# Patient Record
Sex: Female | Born: 1945
Health system: Southern US, Community
[De-identification: ages and names within clinical notes are randomized; demographics above are authoritative.]

## PROBLEM LIST (undated history)

## (undated) DIAGNOSIS — E039 Hypothyroidism, unspecified: Secondary | ICD-10-CM

## (undated) DIAGNOSIS — F32A Depression, unspecified: Secondary | ICD-10-CM

## (undated) DIAGNOSIS — J449 Chronic obstructive pulmonary disease, unspecified: Secondary | ICD-10-CM

## (undated) DIAGNOSIS — F329 Major depressive disorder, single episode, unspecified: Secondary | ICD-10-CM

## (undated) HISTORY — PX: VAGINAL HYSTERECTOMY: SUR661

## (undated) HISTORY — PX: KNEE SURGERY: SHX244

## (undated) HISTORY — PX: APPENDECTOMY: SHX54

---

## 2013-04-30 ENCOUNTER — Emergency Department (HOSPITAL_COMMUNITY): Payer: Medicare Other

## 2013-04-30 ENCOUNTER — Inpatient Hospital Stay (HOSPITAL_COMMUNITY)
Admission: EM | Admit: 2013-04-30 | Discharge: 2013-05-03 | DRG: 481 | Disposition: A | Discharge status: Home Health | Payer: Medicare Other | Attending: Internal Medicine | Admitting: Internal Medicine

## 2013-04-30 ENCOUNTER — Encounter (HOSPITAL_COMMUNITY): Payer: Self-pay | Admitting: Emergency Medicine

## 2013-04-30 ENCOUNTER — Inpatient Hospital Stay (HOSPITAL_COMMUNITY): Payer: Medicare Other

## 2013-04-30 DIAGNOSIS — Y92009 Unspecified place in unspecified non-institutional (private) residence as the place of occurrence of the external cause: Secondary | ICD-10-CM

## 2013-04-30 DIAGNOSIS — E039 Hypothyroidism, unspecified: Secondary | ICD-10-CM | POA: Diagnosis present

## 2013-04-30 DIAGNOSIS — F411 Generalized anxiety disorder: Secondary | ICD-10-CM | POA: Diagnosis present

## 2013-04-30 DIAGNOSIS — I959 Hypotension, unspecified: Secondary | ICD-10-CM | POA: Diagnosis present

## 2013-04-30 DIAGNOSIS — F17210 Nicotine dependence, cigarettes, uncomplicated: Secondary | ICD-10-CM

## 2013-04-30 DIAGNOSIS — J4489 Other specified chronic obstructive pulmonary disease: Secondary | ICD-10-CM | POA: Diagnosis present

## 2013-04-30 DIAGNOSIS — S72143A Displaced intertrochanteric fracture of unspecified femur, initial encounter for closed fracture: Principal | ICD-10-CM | POA: Diagnosis present

## 2013-04-30 DIAGNOSIS — Z7982 Long term (current) use of aspirin: Secondary | ICD-10-CM

## 2013-04-30 DIAGNOSIS — D62 Acute posthemorrhagic anemia: Secondary | ICD-10-CM | POA: Diagnosis not present

## 2013-04-30 DIAGNOSIS — W010XXA Fall on same level from slipping, tripping and stumbling without subsequent striking against object, initial encounter: Secondary | ICD-10-CM | POA: Diagnosis present

## 2013-04-30 DIAGNOSIS — J441 Chronic obstructive pulmonary disease with (acute) exacerbation: Secondary | ICD-10-CM

## 2013-04-30 DIAGNOSIS — S72009A Fracture of unspecified part of neck of unspecified femur, initial encounter for closed fracture: Secondary | ICD-10-CM

## 2013-04-30 DIAGNOSIS — Z79899 Other long term (current) drug therapy: Secondary | ICD-10-CM

## 2013-04-30 DIAGNOSIS — S72002A Fracture of unspecified part of neck of left femur, initial encounter for closed fracture: Secondary | ICD-10-CM | POA: Diagnosis present

## 2013-04-30 DIAGNOSIS — F39 Unspecified mood [affective] disorder: Secondary | ICD-10-CM | POA: Diagnosis present

## 2013-04-30 DIAGNOSIS — J449 Chronic obstructive pulmonary disease, unspecified: Secondary | ICD-10-CM | POA: Diagnosis present

## 2013-04-30 DIAGNOSIS — D72829 Elevated white blood cell count, unspecified: Secondary | ICD-10-CM | POA: Diagnosis present

## 2013-04-30 DIAGNOSIS — F17200 Nicotine dependence, unspecified, uncomplicated: Secondary | ICD-10-CM | POA: Diagnosis present

## 2013-04-30 HISTORY — DX: Hypothyroidism, unspecified: E03.9

## 2013-04-30 HISTORY — DX: Depression, unspecified: F32.A

## 2013-04-30 HISTORY — DX: Major depressive disorder, single episode, unspecified: F32.9

## 2013-04-30 LAB — CBC WITH DIFFERENTIAL/PLATELET
BASOS PCT: 0 % (ref 0–1)
Basophils Absolute: 0 10*3/uL (ref 0.0–0.1)
EOS ABS: 0 10*3/uL (ref 0.0–0.7)
EOS PCT: 0 % (ref 0–5)
HCT: 42.4 % (ref 36.0–46.0)
Hemoglobin: 14.5 g/dL (ref 12.0–15.0)
LYMPHS ABS: 1.5 10*3/uL (ref 0.7–4.0)
Lymphocytes Relative: 10 % — ABNORMAL LOW (ref 12–46)
MCH: 34.4 pg — ABNORMAL HIGH (ref 26.0–34.0)
MCHC: 34.2 g/dL (ref 30.0–36.0)
MCV: 100.7 fL — AB (ref 78.0–100.0)
Monocytes Absolute: 0.5 10*3/uL (ref 0.1–1.0)
Monocytes Relative: 3 % (ref 3–12)
Neutro Abs: 12.9 10*3/uL — ABNORMAL HIGH (ref 1.7–7.7)
Neutrophils Relative %: 87 % — ABNORMAL HIGH (ref 43–77)
PLATELETS: 244 10*3/uL (ref 150–400)
RBC: 4.21 MIL/uL (ref 3.87–5.11)
RDW: 12.7 % (ref 11.5–15.5)
WBC: 14.8 10*3/uL — ABNORMAL HIGH (ref 4.0–10.5)

## 2013-04-30 LAB — URINE MICROSCOPIC-ADD ON

## 2013-04-30 LAB — CG4 I-STAT (LACTIC ACID): Lactic Acid, Venous: 2 mmol/L (ref 0.5–2.2)

## 2013-04-30 LAB — SURGICAL PCR SCREEN
MRSA, PCR: INVALID — AB
Staphylococcus aureus: INVALID — AB

## 2013-04-30 LAB — BASIC METABOLIC PANEL
BUN: 12 mg/dL (ref 6–23)
CALCIUM: 9.1 mg/dL (ref 8.4–10.5)
CO2: 24 meq/L (ref 19–32)
Chloride: 108 mEq/L (ref 96–112)
Creatinine, Ser: 0.68 mg/dL (ref 0.50–1.10)
GFR calc Af Amer: >90 mL/min (ref >90)
GFR calc non Af Amer: 90 mL/min — ABNORMAL LOW (ref >90)
Glucose, Bld: 123 mg/dL — ABNORMAL HIGH (ref 70–99)
Potassium: 4.8 mEq/L (ref 3.7–5.3)
SODIUM: 144 meq/L (ref 137–147)

## 2013-04-30 LAB — TYPE AND SCREEN
ABO/RH(D): O POS
Antibody Screen: NEGATIVE

## 2013-04-30 LAB — CORTISOL: Cortisol, Plasma: 28.2 ug/dL

## 2013-04-30 LAB — VITAMIN B12: VITAMIN B 12: 342 pg/mL (ref 211–911)

## 2013-04-30 LAB — URINALYSIS, ROUTINE W REFLEX MICROSCOPIC
Bilirubin Urine: NEGATIVE
Glucose, UA: NEGATIVE mg/dL
Ketones, ur: 15 mg/dL — AB
LEUKOCYTES UA: NEGATIVE
NITRITE: NEGATIVE
Protein, ur: 30 mg/dL — AB
Specific Gravity, Urine: 1.022 (ref 1.005–1.030)
Urobilinogen, UA: 0.2 mg/dL (ref 0.0–1.0)
pH: 5.5 (ref 5.0–8.0)

## 2013-04-30 LAB — FOLATE: FOLATE: 9.4 ng/mL

## 2013-04-30 LAB — ABO/RH: ABO/RH(D): O POS

## 2013-04-30 LAB — PROTIME-INR
INR: 0.92 (ref 0.00–1.49)
PROTHROMBIN TIME: 12.2 s (ref 11.6–15.2)

## 2013-04-30 MED ORDER — ONDANSETRON HCL 4 MG/2ML IJ SOLN
4.0000 mg | Freq: Once | INTRAMUSCULAR | Status: AC
  Administered 2013-04-30: 4 mg via INTRAVENOUS
  Filled 2013-04-30: qty 2

## 2013-04-30 MED ORDER — NICOTINE 14 MG/24HR TD PT24
14.0000 mg | MEDICATED_PATCH | Freq: Every day | TRANSDERMAL | Status: DC
  Administered 2013-04-30 – 2013-05-03 (×4): 14 mg via TRANSDERMAL
  Filled 2013-04-30 (×4): qty 1

## 2013-04-30 MED ORDER — FENTANYL CITRATE 0.05 MG/ML IJ SOLN
50.0000 ug | INTRAMUSCULAR | Status: AC | PRN
  Administered 2013-04-30 (×2): 50 ug via INTRAVENOUS
  Filled 2013-04-30 (×2): qty 2

## 2013-04-30 MED ORDER — MORPHINE SULFATE 2 MG/ML IJ SOLN
0.5000 mg | INTRAMUSCULAR | Status: DC | PRN
  Administered 2013-04-30 (×2): 0.5 mg via INTRAVENOUS
  Filled 2013-04-30 (×2): qty 1

## 2013-04-30 MED ORDER — SODIUM CHLORIDE 0.9 % IV SOLN
INTRAVENOUS | Status: AC
  Administered 2013-04-30: 06:00:00 via INTRAVENOUS

## 2013-04-30 MED ORDER — SODIUM CHLORIDE 0.9 % IV SOLN
1000.0000 mL | INTRAVENOUS | Status: DC
  Administered 2013-04-30: 1000 mL via INTRAVENOUS

## 2013-04-30 MED ORDER — LEVOTHYROXINE SODIUM 88 MCG PO TABS
88.0000 ug | ORAL_TABLET | Freq: Every day | ORAL | Status: DC
  Administered 2013-04-30 – 2013-05-03 (×3): 88 ug via ORAL
  Filled 2013-04-30 (×5): qty 1

## 2013-04-30 MED ORDER — ALPRAZOLAM 0.5 MG PO TABS
0.5000 mg | ORAL_TABLET | Freq: Every day | ORAL | Status: DC | PRN
  Administered 2013-04-30 – 2013-05-02 (×3): 0.5 mg via ORAL
  Filled 2013-04-30 (×3): qty 1

## 2013-04-30 MED ORDER — VENLAFAXINE HCL ER 150 MG PO CP24
150.0000 mg | ORAL_CAPSULE | Freq: Every day | ORAL | Status: DC
  Administered 2013-04-30 – 2013-05-03 (×4): 150 mg via ORAL
  Filled 2013-04-30 (×5): qty 1

## 2013-04-30 MED ORDER — SODIUM CHLORIDE 0.9 % IV BOLUS (SEPSIS)
1000.0000 mL | Freq: Once | INTRAVENOUS | Status: AC
  Administered 2013-04-30: 1000 mL via INTRAVENOUS

## 2013-04-30 MED ORDER — SODIUM CHLORIDE 0.9 % IV SOLN: INTRAVENOUS | Status: DC

## 2013-04-30 MED ORDER — ENOXAPARIN SODIUM 40 MG/0.4ML ~~LOC~~ SOLN
40.0000 mg | SUBCUTANEOUS | Status: DC
  Administered 2013-04-30: 40 mg via SUBCUTANEOUS
  Filled 2013-04-30 (×2): qty 0.4

## 2013-04-30 MED ORDER — HYDROCODONE-ACETAMINOPHEN 5-325 MG PO TABS
1.0000 | ORAL_TABLET | Freq: Four times a day (QID) | ORAL | Status: DC | PRN
  Administered 2013-04-30 (×3): 2 via ORAL
  Filled 2013-04-30 (×3): qty 2

## 2013-04-30 MED ORDER — IPRATROPIUM-ALBUTEROL 0.5-2.5 (3) MG/3ML IN SOLN: 3.0000 mL | Freq: Four times a day (QID) | RESPIRATORY_TRACT | Status: DC | PRN

## 2013-04-30 MED ORDER — SENNA 8.6 MG PO TABS
1.0000 | ORAL_TABLET | Freq: Two times a day (BID) | ORAL | Status: DC
  Administered 2013-04-30 – 2013-05-03 (×7): 8.6 mg via ORAL
  Filled 2013-04-30 (×8): qty 1

## 2013-04-30 MED ORDER — FLUOXETINE HCL 20 MG PO CAPS
20.0000 mg | ORAL_CAPSULE | Freq: Every day | ORAL | Status: DC
  Administered 2013-04-30 – 2013-05-03 (×4): 20 mg via ORAL
  Filled 2013-04-30 (×4): qty 1

## 2013-04-30 NOTE — Consult Note (Signed)
ORTHOPAEDIC CONSULTATION  REQUESTING PHYSICIAN: Berle Mull, MD  Chief Complaint: Left hip fracture  HPI: Victoria Gaines is a 68 y.o. female who complains of left hip fracture s/p mechanical fall.  Denies LOC, neck pain, abd pain.  Denies hip pain prior to fall.  No past medical history on file. No past surgical history on file. History   Social History  . Marital Status: Married    Spouse Name: N/A    Number of Children: N/A  . Years of Education: N/A   Social History Main Topics  . Smoking status: Not on file  . Smokeless tobacco: Not on file  . Alcohol Use: Not on file  . Drug Use: Not on file  . Sexual Activity: Not on file   Other Topics Concern  . Not on file   Social History Narrative  . No narrative on file   No family history on file. Allergies  Allergen Reactions  . Macrodantin [Nitrofurantoin Macrocrystal] Rash   Prior to Admission medications   Medication Sig Start Date End Date Taking? Authorizing Provider  ALPRAZolam Duanne Moron) 0.5 MG tablet Take 0.5 mg by mouth daily as needed for anxiety.   Yes Historical Provider, MD  FLUoxetine (PROZAC) 20 MG capsule Take 20 mg by mouth daily.   Yes Historical Provider, MD  levothyroxine (SYNTHROID, LEVOTHROID) 88 MCG tablet Take 88 mcg by mouth daily before breakfast.   Yes Historical Provider, MD  venlafaxine XR (EFFEXOR-XR) 150 MG 24 hr capsule Take 150 mg by mouth daily with breakfast.   Yes Historical Provider, MD   Dg Chest 1 View  04/30/2013   CLINICAL DATA:  Fall, hip pain.  EXAM: CHEST - 1 VIEW  COMPARISON:  Chest radiograph July 21, 2008  FINDINGS: Cardiomediastinal silhouette is unremarkable. The lungs are clear without pleural effusions or focal consolidations. Pulmonary vasculature is unremarkable. Trachea projects midline and there is no pneumothorax. Soft tissue planes and included osseous structures are nonsuspicious.  IMPRESSION: No acute cardiopulmonary process.   Electronically Signed   By: Elon Alas   On: 04/30/2013 04:50   Dg Hip Complete Left  04/30/2013   CLINICAL DATA:  Fall, hip pain.  EXAM: LEFT HIP - COMPLETE 2+ VIEW  COMPARISON:  None available for comparison at time of study interpretation.  FINDINGS: Comminuted impacted left intertrochanteric femur fracture with varus angulation distal bony fragment. Femoral head is located. No dislocation. No destructive bony lesions. Periarticular soft tissue planes are nonsuspicious. Phleboliths in the pelvis.  IMPRESSION: Comminuted impacted displaced left hip femur intertrochanteric fracture without dislocation.   Electronically Signed   By: Elon Alas   On: 04/30/2013 04:49    Positive ROS: All other systems have been reviewed and were otherwise negative with the exception of those mentioned in the HPI and as above.  Physical Exam: General: Alert, no acute distress Cardiovascular: No pedal edema Respiratory: No cyanosis, no use of accessory musculature GI: No organomegaly, abdomen is soft and non-tender Skin: No lesions in the area of chief complaint Neurologic: Sensation intact distally Psychiatric: Patient is competent for consent with normal mood and affect Lymphatic: No axillary or cervical lymphadenopathy  MUSCULOSKELETAL:  Left hip - limited ROM due to pain - WWP and NVI - skin intact  Assessment: Left hip pertrochanteric fx  Plan: - plan for surgery Sunday - can eat Saturday and then NPO after midnight - discussed r/b/a of surgery  Thank you for the consult and the opportunity to see Ms. Aispuro  N.  Eduard Roux, MD [place] 5:47 AM

## 2013-04-30 NOTE — H&P (Signed)

## 2013-04-30 NOTE — Progress Notes (Signed)
[facility] PROGRESS NOTE  Victoria Gaines OJJ:[id number] DOB: 1945-08-31 DOA: 04/30/2013 PCP: Glenda Chroman., MD  Assessment/Plan: 69 y.o. female with Past medical history of more disorder, hypothyroidism active smoker presented with mechanical fall, L hip fracture   1. Fall/mechnical L hip fracture;  -per ortho; OR in AM, cont management per ortho   2. Leukocytosis likely stress related; no s/s of infection; CXR; no infiltrates; cont monitoring   3. Hypothyroidism Continue Synthroid check TSH   4. Mood disorder Continue Xanax and Effexor   5. Active smoker Counseled to quit smoking Nicotine patch    Code Status: full Family Communication: d/w patient (indicate person spoken with, relationship, and if by phone, the number) Disposition Plan: ped surgery   Consultants:  ortho  Procedures:  None   Antibiotics:  None  (indicate start date, and stop date if known)  HPI/Subjective: alert  Objective: Filed Vitals:   04/30/13 0620  BP: 114/70  Pulse: 88  Temp: 98.5 F (36.9 C)  Resp:     Intake/Output Summary (Last 24 hours) at 04/30/13 1136 Last data filed at 04/30/13 0900  Gross per 24 hour  Intake    120 ml  Output      0 ml  Net    120 ml   There were no vitals filed for this visit.  Exam:   General:  alert  Cardiovascular: s1,s2 rrr  Respiratory: CTA BL  Abdomen: soft, nt, nd   Musculoskeletal: no LE edema   Data Reviewed: Basic Metabolic Panel:  Recent Labs Lab 04/30/13 0319  NA 144  K 4.8  CL 108  CO2 24  GLUCOSE 123*  BUN 12  CREATININE 0.68  CALCIUM 9.1   Liver Function Tests: No results found for this basename: AST, ALT, ALKPHOS, BILITOT, PROT, ALBUMIN,  in the last 168 hours No results found for this basename: LIPASE, AMYLASE,  in the last 168 hours No results found for this basename: AMMONIA,  in the last 168 hours CBC:  Recent Labs Lab 04/30/13 0319  WBC 14.8*  NEUTROABS 12.9*  HGB 14.5  HCT 42.4  MCV 100.7*   PLT 244   Cardiac Enzymes: No results found for this basename: CKTOTAL, CKMB, CKMBINDEX, TROPONINI,  in the last 168 hours BNP (last 3 results) No results found for this basename: PROBNP,  in the last 8760 hours CBG: No results found for this basename: GLUCAP,  in the last 168 hours  No results found for this or any previous visit (from the past 240 hour(s)).   Studies: Dg Chest 1 View  04/30/2013   CLINICAL DATA:  Fall, hip pain.  EXAM: CHEST - 1 VIEW  COMPARISON:  Chest radiograph July 21, 2008  FINDINGS: Cardiomediastinal silhouette is unremarkable. The lungs are clear without pleural effusions or focal consolidations. Pulmonary vasculature is unremarkable. Trachea projects midline and there is no pneumothorax. Soft tissue planes and included osseous structures are nonsuspicious.  IMPRESSION: No acute cardiopulmonary process.   Electronically Signed   By: Elon Alas   On: 04/30/2013 04:50   Dg Hip Complete Left  04/30/2013   CLINICAL DATA:  Fall, hip pain.  EXAM: LEFT HIP - COMPLETE 2+ VIEW  COMPARISON:  None available for comparison at time of study interpretation.  FINDINGS: Comminuted impacted left intertrochanteric femur fracture with varus angulation distal bony fragment. Femoral head is located. No dislocation. No destructive bony lesions. Periarticular soft tissue planes are nonsuspicious. Phleboliths in the pelvis.  IMPRESSION: Comminuted impacted displaced left hip femur  intertrochanteric fracture without dislocation.   Electronically Signed   By: Elon Alas   On: 04/30/2013 04:49   Dg Femur Left  04/30/2013   CLINICAL DATA:  Fracture.  EXAM: LEFT FEMUR - 2 VIEW  COMPARISON:  Pelvic and hip radiographs April 30, 2013 at 4:21 a.m.  FINDINGS: Comminuted left femur intertrochanteric fracture partially imaged, no distal femur fracture. No destructive bony lesions. No knee dislocation. Soft tissue planes are nonsuspicious.  IMPRESSION: Partially imaged femur  intertrochanteric fracture with no additional femur fractures.   Electronically Signed   By: Elon Alas   On: 04/30/2013 06:59    Scheduled Meds: . sodium chloride   Intravenous STAT  . enoxaparin (LOVENOX) injection  40 mg Subcutaneous Q24H  . FLUoxetine  20 mg Oral Daily  . levothyroxine  88 mcg Oral QAC breakfast  . nicotine  14 mg Transdermal Daily  . senna  1 tablet Oral BID  . venlafaxine XR  150 mg Oral Q breakfast   Continuous Infusions: . sodium chloride Stopped (04/30/13 0501)  . sodium chloride 100 mL/hr at 04/30/13 4098    Principal Problem:   Closed left hip fracture Active Problems:   Hypothyroidism   Mood disorder   Hypotension   Smoker    Time spent: >35 minutes     Kinnie Feil  [facility] Pager [phone]. If 7PM-7AM, please contact night-coverage at [email or web address], password [email or web address] 04/30/2013, 11:36 AM  LOS: 0 days

## 2013-04-30 NOTE — ED Provider Notes (Signed)
CSN: [id number]     Arrival date & time 04/30/13  0245 History   First MD Initiated Contact with Patient 04/30/13 0316     No chief complaint on file.  (Consider location/radiation/quality/duration/timing/severity/associated sxs/prior Treatment) HPI History provided by patient. At home tonight fell injuring her left hip. Unable to ambulate and EMS was called. Prominent by EMS with left lower extremity shortening and external rotation. Patient denies hitting her head or injuring her neck. No LOC. No weakness or numbness. No other pain or trauma. The pain is sharp in quality, able to we will her toes but unable to move her leg otherwise.   No past medical history on file. No past surgical history on file. No family history on file. History  Substance Use Topics  . Smoking status: Not on file  . Smokeless tobacco: Not on file  . Alcohol Use: Not on file   OB History   No data available     Review of Systems  Constitutional: Negative for fever and chills.  Eyes: Negative for visual disturbance.  Respiratory: Negative for shortness of breath.   Cardiovascular: Negative for chest pain.  Gastrointestinal: Negative for vomiting and abdominal pain.  Genitourinary: Negative for flank pain.  Musculoskeletal: Negative for back pain, neck pain and neck stiffness.  Skin: Negative for rash.  Neurological: Negative for headaches.  All other systems reviewed and are negative.    Allergies  Macrodantin  Home Medications   Current Outpatient Rx  Name  Route  Sig  Dispense  Refill  . ALPRAZolam (XANAX) 0.5 MG tablet   Oral   Take 0.5 mg by mouth daily as needed for anxiety.         Marland Kitchen FLUoxetine (PROZAC) 20 MG capsule   Oral   Take 20 mg by mouth daily.         Marland Kitchen levothyroxine (SYNTHROID, LEVOTHROID) 88 MCG tablet   Oral   Take 88 mcg by mouth daily before breakfast.         . venlafaxine XR (EFFEXOR-XR) 150 MG 24 hr capsule   Oral   Take 150 mg by mouth daily with  breakfast.          BP 84/61  Pulse 64  SpO2 99% Physical Exam  Constitutional: She is oriented to person, place, and time. She appears well-developed and well-nourished.  HENT:  Head: Normocephalic and atraumatic.  Eyes: EOM are normal. Pupils are equal, round, and reactive to light.  Neck: Neck supple.  Cardiovascular: Normal rate, regular rhythm and intact distal pulses.   Pulmonary/Chest: Effort normal and breath sounds normal. No respiratory distress.  Abdominal: Soft. Bowel sounds are normal. She exhibits no distension.  Musculoskeletal: She exhibits no edema.  Pelvis stable. Tender over left hip. Left lower extremity with shortening and external rotation. Equal dorsalis pedis pulses. Distal motor and sensorium intact. Unable to move left hip secondary to pain  Neurological: She is alert and oriented to person, place, and time.  Skin: Skin is warm and dry.    ED Course  Procedures (including critical care time) Labs Review Labs Reviewed  BASIC METABOLIC PANEL - Abnormal; Notable for the following:    Glucose, Bld 123 (*)    GFR calc non Af Amer 90 (*)    All other components within normal limits  CBC WITH DIFFERENTIAL - Abnormal; Notable for the following:    WBC 14.8 (*)    MCV 100.7 (*)    MCH 34.4 (*)    Neutrophils  Relative % 87 (*)    Neutro Abs 12.9 (*)    Lymphocytes Relative 10 (*)    All other components within normal limits  URINALYSIS, ROUTINE W REFLEX MICROSCOPIC - Abnormal; Notable for the following:    Hgb urine dipstick LARGE (*)    Ketones, ur 15 (*)    Protein, ur 30 (*)    All other components within normal limits  URINE MICROSCOPIC-ADD ON - Abnormal; Notable for the following:    Squamous Epithelial / LPF FEW (*)    All other components within normal limits  PROTIME-INR  CORTISOL  VITAMIN D 25 HYDROXY  VITAMIN B12  FOLATE  CG4 I-STAT (LACTIC ACID)  TYPE AND SCREEN  ABO/RH   Imaging Review Dg Chest 1 View  04/30/2013   CLINICAL DATA:   Fall, hip pain.  EXAM: CHEST - 1 VIEW  COMPARISON:  Chest radiograph July 21, 2008  FINDINGS: Cardiomediastinal silhouette is unremarkable. The lungs are clear without pleural effusions or focal consolidations. Pulmonary vasculature is unremarkable. Trachea projects midline and there is no pneumothorax. Soft tissue planes and included osseous structures are nonsuspicious.  IMPRESSION: No acute cardiopulmonary process.   Electronically Signed   By: Elon Alas   On: 04/30/2013 04:50   Dg Hip Complete Left  04/30/2013   CLINICAL DATA:  Fall, hip pain.  EXAM: LEFT HIP - COMPLETE 2+ VIEW  COMPARISON:  None available for comparison at time of study interpretation.  FINDINGS: Comminuted impacted left intertrochanteric femur fracture with varus angulation distal bony fragment. Femoral head is located. No dislocation. No destructive bony lesions. Periarticular soft tissue planes are nonsuspicious. Phleboliths in the pelvis.  IMPRESSION: Comminuted impacted displaced left hip femur intertrochanteric fracture without dislocation.   Electronically Signed   By: Elon Alas   On: 04/30/2013 04:49     4:42 AM Dr Erlinda Hong to see, plan MED admit  IVFs for low BP  5:00 AM Dr Posey Pronto to admit   MDM  Dx: L hip Fx, low BP  Labs and imaging reviewed as above Ortho consult MED admit     Teressa Lower, MD 04/30/13 [phone]

## 2013-04-30 NOTE — ED Notes (Signed)
Per [facility], Pt was walking to the bathroom and fell. She fell on her right hip. Right leg is shorter than left leg. Deformity to Right hip. CNS intact. Pain is 8 out of 10. No cardiac or respiratory distress. 20g R hand. 5 mg of Morphine given by EMS.

## 2013-04-30 NOTE — H&P (Signed)
[facility] History and Physical  Patient: Victoria Gaines  URK:[id number]  DOB: 02/07/1946  DOS: the patient was seen and examined on 04/30/2013 PCP: Glenda Chroman., MD  Chief Complaint: Mechanical fall  HPI: Ashonti Leandro is a 68 y.o. female with Past medical history of more disorder, hypothyroidism active smoker. The patient is coming from home. Patient mentions that today when she was walking in her house she tripped over her coffee table and fell on the ground hitting her left side. She denies any injury to her neck or head or anywhere else. She denies any chest pain, fever, dizziness, lightheadedness, vertigo, focal neurological deficit. She also denies any shortness of breath or diarrhea constipation nausea or vomiting. She denies any burning urination. She denies any changes in her medication. At the time of my evaluation she mentions her pain is well-controlled. She was initially hypotensive in the ED but her blood pressure improved after bolus.  Review of Systems: as mentioned in the history of present illness.  A Comprehensive review of the other systems is negative.  No past medical history on file. No past surgical history on file. Social History:  has no tobacco, alcohol, and drug history on file. Independent for most of her  ADL.  Allergies  Allergen Reactions  . Macrodantin [Nitrofurantoin Macrocrystal] Rash    No family history on file.  Prior to Admission medications   Medication Sig Start Date End Date Taking? Authorizing Provider  ALPRAZolam Duanne Moron) 0.5 MG tablet Take 0.5 mg by mouth daily as needed for anxiety.   Yes Historical Provider, MD  FLUoxetine (PROZAC) 20 MG capsule Take 20 mg by mouth daily.   Yes Historical Provider, MD  levothyroxine (SYNTHROID, LEVOTHROID) 88 MCG tablet Take 88 mcg by mouth daily before breakfast.   Yes Historical Provider, MD  venlafaxine XR (EFFEXOR-XR) 150 MG 24 hr capsule Take 150 mg by mouth daily with breakfast.   Yes  Historical Provider, MD    Physical Exam: Filed Vitals:   04/30/13 0345 04/30/13 0400 04/30/13 0445 04/30/13 0500  BP: 94/56 103/63 92/69 103/65  Pulse: 69 70 78 68  SpO2: 99% 100% 95% 95%    General: Alert, Awake and Oriented to Time, Place and Person. Appear in moderate distress Eyes: PERRL ENT: Oral Mucosa clear moist. Neck: No JVD Cardiovascular: S1 and S2 Present, no Murmur, Peripheral Pulses Present Respiratory: Bilateral Air entry equal and Decreased, Clear to Auscultation,  No Crackles, no wheezes Abdomen: Bowel Sound Present, Soft and Non tender Skin: No Rash Extremities: Trace Pedal edema, no calf tenderness, left leg rotated and shortened, pulses present, sensation present Neurologic: Grossly Unremarkable.  Labs on Admission:  CBC:  Recent Labs Lab 04/30/13 0319  WBC 14.8*  NEUTROABS 12.9*  HGB 14.5  HCT 42.4  MCV 100.7*  PLT 244    CMP     Component Value Date/Time   NA 144 04/30/2013 0319   K 4.8 04/30/2013 0319   CL 108 04/30/2013 0319   CO2 24 04/30/2013 0319   GLUCOSE 123* 04/30/2013 0319   BUN 12 04/30/2013 0319   CREATININE 0.68 04/30/2013 0319   CALCIUM 9.1 04/30/2013 0319   GFRNONAA 90* 04/30/2013 0319   GFRAA >90 04/30/2013 0319    No results found for this basename: LIPASE, AMYLASE,  in the last 168 hours No results found for this basename: AMMONIA,  in the last 168 hours  No results found for this basename: CKTOTAL, CKMB, CKMBINDEX, TROPONINI,  in the last 168 hours BNP (last  3 results) No results found for this basename: PROBNP,  in the last 8760 hours  Radiological Exams on Admission: Dg Chest 1 View  04/30/2013   CLINICAL DATA:  Fall, hip pain.  EXAM: CHEST - 1 VIEW  COMPARISON:  Chest radiograph July 21, 2008  FINDINGS: Cardiomediastinal silhouette is unremarkable. The lungs are clear without pleural effusions or focal consolidations. Pulmonary vasculature is unremarkable. Trachea projects midline and there is no pneumothorax. Soft tissue  planes and included osseous structures are nonsuspicious.  IMPRESSION: No acute cardiopulmonary process.   Electronically Signed   By: Elon Alas   On: 04/30/2013 04:50   Dg Hip Complete Left  04/30/2013   CLINICAL DATA:  Fall, hip pain.  EXAM: LEFT HIP - COMPLETE 2+ VIEW  COMPARISON:  None available for comparison at time of study interpretation.  FINDINGS: Comminuted impacted left intertrochanteric femur fracture with varus angulation distal bony fragment. Femoral head is located. No dislocation. No destructive bony lesions. Periarticular soft tissue planes are nonsuspicious. Phleboliths in the pelvis.  IMPRESSION: Comminuted impacted displaced left hip femur intertrochanteric fracture without dislocation.   Electronically Signed   By: Elon Alas   On: 04/30/2013 04:49     Assessment/Plan Principal Problem:   Closed left hip fracture Active Problems:   Hypothyroidism   Mood disorder   Hypotension   Smoker   1. Closed left hip fracture The patient is presenting with a fall that has led to comminuted right hip fracture. Orthopedic has been consulted who will follow the patient and patient may need surgery. She will be kept n.p.o. She'll be placed on IV hydration. Since she is active smoker and has signs of COPD with decrease breath sounds in it I will put her on Combivent and incentive spirometry.  2. Hypothyroidism Continue Synthroid check TSH  3. Mood disorder Continue Xanax and Effexor  4. Active smoker Counseled to quit smoking Nicotine patch  Consults: Orthopedics  DVT Prophylaxis: subcutaneous Heparin Nutrition: N.p.o.  Code Status: Full  Family Communication: Family was present at bedside, opportunity was given to ask question and all questions were answered satisfactorily at the time of interview. Disposition: Admitted to inpatient in telemetry unit.  Author: Berle Mull, MD [facility] Pager: [phone] 04/30/2013, 5:27 AM    If 7PM-7AM,  please contact night-coverage [email or web address] Password [email or web address]

## 2013-04-30 NOTE — Progress Notes (Signed)
Orthopedic Tech Progress Note Patient Details:  Victoria Gaines 07-20-1945 [id number] OHF applied to bed Patient ID: Victoria Gaines, female   DOB: 1945-08-19, 68 y.o.   MRN: [id number]   Victoria Gaines 04/30/2013, 8:43 AM

## 2013-04-30 NOTE — ED Notes (Signed)
Orthopedic Surgeon at bedside

## 2013-04-30 NOTE — ED Notes (Signed)
Report given to 5N Rn. Belongings and paperwork sent upstairs with pt. Pt went to radiology prior to transportation.  Transported by Yvone Neu EMT.

## 2013-05-01 ENCOUNTER — Encounter (HOSPITAL_COMMUNITY): Payer: Medicare Other | Admitting: Anesthesiology

## 2013-05-01 ENCOUNTER — Inpatient Hospital Stay (HOSPITAL_COMMUNITY): Payer: Medicare Other | Admitting: Anesthesiology

## 2013-05-01 ENCOUNTER — Inpatient Hospital Stay (HOSPITAL_COMMUNITY): Payer: Medicare Other

## 2013-05-01 ENCOUNTER — Encounter (HOSPITAL_COMMUNITY)
Admission: EM | Disposition: A | Discharge status: Home Health | Payer: Self-pay | Source: Home / Self Care | Attending: Internal Medicine

## 2013-05-01 HISTORY — PX: INTRAMEDULLARY (IM) NAIL INTERTROCHANTERIC: SHX5875

## 2013-05-01 LAB — CBC
HEMATOCRIT: 38.9 % (ref 36.0–46.0)
HEMOGLOBIN: 12.6 g/dL (ref 12.0–15.0)
MCH: 33.3 pg (ref 26.0–34.0)
MCHC: 32.4 g/dL (ref 30.0–36.0)
MCV: 102.9 fL — ABNORMAL HIGH (ref 78.0–100.0)
Platelets: 221 10*3/uL (ref 150–400)
RBC: 3.78 MIL/uL — AB (ref 3.87–5.11)
RDW: 13 % (ref 11.5–15.5)
WBC: 10 10*3/uL (ref 4.0–10.5)

## 2013-05-01 LAB — SURGICAL PCR SCREEN
MRSA, PCR: NEGATIVE
Staphylococcus aureus: NEGATIVE

## 2013-05-01 SURGERY — FIXATION, FRACTURE, INTERTROCHANTERIC, WITH INTRAMEDULLARY ROD
Anesthesia: General | Site: Hip | Laterality: Left

## 2013-05-01 MED ORDER — FENTANYL CITRATE 0.05 MG/ML IJ SOLN
INTRAMUSCULAR | Status: AC
  Filled 2013-05-01: qty 5

## 2013-05-01 MED ORDER — CHLORHEXIDINE GLUCONATE 4 % EX LIQD
1.0000 "application " | Freq: Once | CUTANEOUS | Status: DC
  Filled 2013-05-01: qty 15

## 2013-05-01 MED ORDER — OXYCODONE HCL 5 MG PO TABS: 5.0000 mg | ORAL_TABLET | ORAL | Status: DC | PRN

## 2013-05-01 MED ORDER — METOCLOPRAMIDE HCL 5 MG/ML IJ SOLN: 5.0000 mg | Freq: Three times a day (TID) | INTRAMUSCULAR | Status: DC | PRN

## 2013-05-01 MED ORDER — SENNA 8.6 MG PO TABS: 1.0000 | ORAL_TABLET | Freq: Two times a day (BID) | ORAL | Status: DC

## 2013-05-01 MED ORDER — METHOCARBAMOL 100 MG/ML IJ SOLN: 500.0000 mg | Freq: Four times a day (QID) | INTRAVENOUS | Status: DC | PRN

## 2013-05-01 MED ORDER — CHLORHEXIDINE GLUCONATE 4 % EX LIQD
60.0000 mL | Freq: Once | CUTANEOUS | Status: DC
  Filled 2013-05-01: qty 60

## 2013-05-01 MED ORDER — 0.9 % SODIUM CHLORIDE (POUR BTL) OPTIME
TOPICAL | Status: DC | PRN
  Administered 2013-05-01: 1000 mL

## 2013-05-01 MED ORDER — EPHEDRINE SULFATE 50 MG/ML IJ SOLN
INTRAMUSCULAR | Status: AC
  Filled 2013-05-01: qty 1

## 2013-05-01 MED ORDER — MIDAZOLAM HCL 5 MG/5ML IJ SOLN
INTRAMUSCULAR | Status: DC | PRN
  Administered 2013-05-01 (×2): 1 mg via INTRAVENOUS

## 2013-05-01 MED ORDER — SODIUM CHLORIDE 0.9 % IV SOLN
INTRAVENOUS | Status: DC
  Administered 2013-05-01 – 2013-05-02 (×2): via INTRAVENOUS

## 2013-05-01 MED ORDER — SODIUM CHLORIDE 0.9 % IV SOLN: INTRAVENOUS | Status: DC

## 2013-05-01 MED ORDER — ROCURONIUM BROMIDE 100 MG/10ML IV SOLN
INTRAVENOUS | Status: DC | PRN
  Administered 2013-05-01: 10 mg via INTRAVENOUS
  Administered 2013-05-01: 40 mg via INTRAVENOUS

## 2013-05-01 MED ORDER — MENTHOL 3 MG MT LOZG: 1.0000 | LOZENGE | OROMUCOSAL | Status: DC | PRN

## 2013-05-01 MED ORDER — HYDROMORPHONE HCL PF 1 MG/ML IJ SOLN: 0.2500 mg | INTRAMUSCULAR | Status: DC | PRN

## 2013-05-01 MED ORDER — MIDAZOLAM HCL 2 MG/2ML IJ SOLN
INTRAMUSCULAR | Status: AC
  Filled 2013-05-01: qty 2

## 2013-05-01 MED ORDER — ASPIRIN EC 325 MG PO TBEC: 325.0000 mg | DELAYED_RELEASE_TABLET | Freq: Two times a day (BID) | ORAL | Status: DC

## 2013-05-01 MED ORDER — VECURONIUM BROMIDE 10 MG IV SOLR
INTRAVENOUS | Status: DC | PRN
  Administered 2013-05-01: 2 mg via INTRAVENOUS

## 2013-05-01 MED ORDER — POLYETHYLENE GLYCOL 3350 17 G PO PACK
17.0000 g | PACK | Freq: Every day | ORAL | Status: DC | PRN
  Administered 2013-05-03: 17 g via ORAL
  Filled 2013-05-01: qty 1

## 2013-05-01 MED ORDER — CEFAZOLIN SODIUM-DEXTROSE 2-3 GM-% IV SOLR
2.0000 g | Freq: Four times a day (QID) | INTRAVENOUS | Status: AC
  Administered 2013-05-01 – 2013-05-02 (×3): 2 g via INTRAVENOUS
  Filled 2013-05-01 (×3): qty 50

## 2013-05-01 MED ORDER — METHOCARBAMOL 500 MG PO TABS
500.0000 mg | ORAL_TABLET | Freq: Four times a day (QID) | ORAL | Status: DC | PRN
  Administered 2013-05-01: 500 mg via ORAL
  Filled 2013-05-01: qty 1

## 2013-05-01 MED ORDER — FENTANYL CITRATE 0.05 MG/ML IJ SOLN
INTRAMUSCULAR | Status: DC | PRN
  Administered 2013-05-01 (×3): 50 ug via INTRAVENOUS
  Administered 2013-05-01: 100 ug via INTRAVENOUS

## 2013-05-01 MED ORDER — LACTATED RINGERS IV SOLN
INTRAVENOUS | Status: DC | PRN
Start: 2013-05-01 — End: 2013-05-01
  Administered 2013-05-01 (×2): via INTRAVENOUS

## 2013-05-01 MED ORDER — HYDROCODONE-ACETAMINOPHEN 5-325 MG PO TABS
1.0000 | ORAL_TABLET | Freq: Four times a day (QID) | ORAL | Status: DC | PRN
  Administered 2013-05-01 – 2013-05-03 (×2): 2 via ORAL
  Filled 2013-05-01 (×2): qty 2

## 2013-05-01 MED ORDER — CEFAZOLIN SODIUM-DEXTROSE 2-3 GM-% IV SOLR
2.0000 g | INTRAVENOUS | Status: AC
  Administered 2013-05-01: 2 g via INTRAVENOUS
  Filled 2013-05-01: qty 50

## 2013-05-01 MED ORDER — PROPOFOL 10 MG/ML IV BOLUS
INTRAVENOUS | Status: AC
  Filled 2013-05-01: qty 20

## 2013-05-01 MED ORDER — LIDOCAINE HCL (CARDIAC) 20 MG/ML IV SOLN
INTRAVENOUS | Status: DC | PRN
  Administered 2013-05-01: 50 mg via INTRAVENOUS

## 2013-05-01 MED ORDER — METOCLOPRAMIDE HCL 10 MG PO TABS: 5.0000 mg | ORAL_TABLET | Freq: Three times a day (TID) | ORAL | Status: DC | PRN

## 2013-05-01 MED ORDER — PHENOL 1.4 % MT LIQD: 1.0000 | OROMUCOSAL | Status: DC | PRN

## 2013-05-01 MED ORDER — ONDANSETRON HCL 4 MG PO TABS
4.0000 mg | ORAL_TABLET | Freq: Four times a day (QID) | ORAL | Status: DC | PRN
  Administered 2013-05-01: 4 mg via ORAL
  Filled 2013-05-01: qty 1

## 2013-05-01 MED ORDER — ACETAMINOPHEN 325 MG PO TABS: 650.0000 mg | ORAL_TABLET | Freq: Four times a day (QID) | ORAL | Status: DC | PRN

## 2013-05-01 MED ORDER — ONDANSETRON HCL 4 MG/2ML IJ SOLN
4.0000 mg | Freq: Four times a day (QID) | INTRAMUSCULAR | Status: DC | PRN
  Administered 2013-05-01: 4 mg via INTRAVENOUS
  Filled 2013-05-01: qty 2

## 2013-05-01 MED ORDER — MAGNESIUM CITRATE PO SOLN: 1.0000 | Freq: Once | ORAL | Status: AC | PRN

## 2013-05-01 MED ORDER — SORBITOL 70 % SOLN: 30.0000 mL | Freq: Every day | Status: DC | PRN

## 2013-05-01 MED ORDER — VECURONIUM BROMIDE 10 MG IV SOLR
INTRAVENOUS | Status: AC
  Filled 2013-05-01: qty 10

## 2013-05-01 MED ORDER — ALUM & MAG HYDROXIDE-SIMETH 200-200-20 MG/5ML PO SUSP: 30.0000 mL | ORAL | Status: DC | PRN

## 2013-05-01 MED ORDER — ASPIRIN EC 325 MG PO TBEC
325.0000 mg | DELAYED_RELEASE_TABLET | Freq: Two times a day (BID) | ORAL | Status: DC
  Administered 2013-05-01 – 2013-05-03 (×4): 325 mg via ORAL
  Filled 2013-05-01 (×7): qty 1

## 2013-05-01 MED ORDER — PROPOFOL 10 MG/ML IV BOLUS
INTRAVENOUS | Status: DC | PRN
  Administered 2013-05-01: 150 mg via INTRAVENOUS
  Administered 2013-05-01: 50 mg via INTRAVENOUS

## 2013-05-01 MED ORDER — LIDOCAINE HCL (CARDIAC) 20 MG/ML IV SOLN
INTRAVENOUS | Status: AC
  Filled 2013-05-01: qty 5

## 2013-05-01 MED ORDER — MORPHINE SULFATE 2 MG/ML IJ SOLN
0.5000 mg | INTRAMUSCULAR | Status: DC | PRN
  Administered 2013-05-01: 0.5 mg via INTRAVENOUS
  Filled 2013-05-01: qty 1

## 2013-05-01 MED ORDER — ROCURONIUM BROMIDE 50 MG/5ML IV SOLN
INTRAVENOUS | Status: AC
  Filled 2013-05-01: qty 1

## 2013-05-01 MED ORDER — OXYCODONE HCL 5 MG PO TABS
5.0000 mg | ORAL_TABLET | ORAL | Status: DC | PRN
  Administered 2013-05-01 – 2013-05-02 (×3): 10 mg via ORAL
  Administered 2013-05-03: 5 mg via ORAL
  Filled 2013-05-01 (×2): qty 2
  Filled 2013-05-01: qty 1
  Filled 2013-05-01 (×2): qty 2

## 2013-05-01 MED ORDER — EPHEDRINE SULFATE 50 MG/ML IJ SOLN
INTRAMUSCULAR | Status: DC | PRN
  Administered 2013-05-01 (×3): 10 mg via INTRAVENOUS
  Administered 2013-05-01: 20 mg via INTRAVENOUS

## 2013-05-01 MED ORDER — ACETAMINOPHEN 650 MG RE SUPP: 650.0000 mg | Freq: Four times a day (QID) | RECTAL | Status: DC | PRN

## 2013-05-01 SURGICAL SUPPLY — 49 items
BANDAGE GAUZE ELAST BULKY 4 IN (GAUZE/BANDAGES/DRESSINGS) IMPLANT
BIT DRILL LONG 4.0MM (BIT) ×2 IMPLANT
BIT DRILL SHORT 4.0 (BIT) ×2 IMPLANT
BLADE SURG 15 STRL LF DISP TIS (BLADE) ×1 IMPLANT
BLADE SURG 15 STRL SS (BLADE) ×2
BNDG COHESIVE 4X5 TAN NS LF (GAUZE/BANDAGES/DRESSINGS) ×6 IMPLANT
CLOTH BEACON ORANGE TIMEOUT ST (SAFETY) ×3 IMPLANT
COVER SURGICAL LIGHT HANDLE (MISCELLANEOUS) ×3 IMPLANT
DRAPE PROXIMA HALF (DRAPES) IMPLANT
DRAPE STERI IOBAN 125X83 (DRAPES) ×3 IMPLANT
DRILL BIT LONG 4.0MM (BIT) ×6
DRILL BIT SHORT 4.0 (BIT) ×4
DRILL STEP  6.4 (BIT) ×3 IMPLANT
DRSG MEPILEX BORDER 4X4 (GAUZE/BANDAGES/DRESSINGS) ×12 IMPLANT
DRSG MEPILEX BORDER 4X8 (GAUZE/BANDAGES/DRESSINGS) IMPLANT
DRSG PAD ABDOMINAL 8X10 ST (GAUZE/BANDAGES/DRESSINGS) ×3 IMPLANT
DURAPREP 26ML APPLICATOR (WOUND CARE) ×6 IMPLANT
ELECT CAUTERY BLADE 6.4 (BLADE) ×3 IMPLANT
ELECT REM PT RETURN 9FT ADLT (ELECTROSURGICAL) ×3
ELECTRODE REM PT RTRN 9FT ADLT (ELECTROSURGICAL) ×1 IMPLANT
FACESHIELD LNG OPTICON STERILE (SAFETY) ×3 IMPLANT
GAUZE XEROFORM 5X9 LF (GAUZE/BANDAGES/DRESSINGS) ×3 IMPLANT
GLOVE SURG SS PI 7.5 STRL IVOR (GLOVE) ×6 IMPLANT
GOWN STRL NON-REIN LRG LVL3 (GOWN DISPOSABLE) ×3 IMPLANT
GOWN STRL REIN XL XLG (GOWN DISPOSABLE) ×3 IMPLANT
GUIDE PIN 3.2MM (MISCELLANEOUS) ×2
GUIDE PIN ORTH 343X3.2XBRAD (MISCELLANEOUS) ×1 IMPLANT
GUIDE ROD 3.0 (MISCELLANEOUS) ×3
KIT BASIN OR (CUSTOM PROCEDURE TRAY) ×3 IMPLANT
KIT ROOM TURNOVER OR (KITS) ×3 IMPLANT
MANIFOLD NEPTUNE II (INSTRUMENTS) IMPLANT
NAIL FEMORAL 11.5X40 (Nail) ×3 IMPLANT
NS IRRIG 1000ML POUR BTL (IV SOLUTION) ×3 IMPLANT
PACK GENERAL/GYN (CUSTOM PROCEDURE TRAY) ×3 IMPLANT
PAD ARMBOARD 7.5X6 YLW CONV (MISCELLANEOUS) ×9 IMPLANT
PAD CAST 4YDX4 CTTN HI CHSV (CAST SUPPLIES) ×2 IMPLANT
PADDING CAST COTTON 4X4 STRL (CAST SUPPLIES) ×4
ROD GUIDE 3.0 (MISCELLANEOUS) ×1 IMPLANT
SCREW 6.4X90 (Screw) ×3 IMPLANT
SCREW LOCKING 5.0X80 (Screw) ×3 IMPLANT
SCREW TRIGEN LOW PROF 5.0X42.5 (Screw) ×3 IMPLANT
STAPLER VISISTAT 35W (STAPLE) ×3 IMPLANT
SUT VIC AB 0 CT1 27 (SUTURE) ×4
SUT VIC AB 0 CT1 27XBRD ANBCTR (SUTURE) ×2 IMPLANT
SUT VIC AB 2-0 CT1 27 (SUTURE) ×4
SUT VIC AB 2-0 CT1 TAPERPNT 27 (SUTURE) ×2 IMPLANT
TOWEL OR 17X24 6PK STRL BLUE (TOWEL DISPOSABLE) ×3 IMPLANT
TOWEL OR 17X26 10 PK STRL BLUE (TOWEL DISPOSABLE) ×3 IMPLANT
WATER STERILE IRR 1000ML POUR (IV SOLUTION) IMPLANT

## 2013-05-01 NOTE — Anesthesia Procedure Notes (Signed)
Procedure Name: Intubation Date/Time: 05/01/2013 7:38 AM Performed by: Marinda Elk A Pre-anesthesia Checklist: Patient identified, Timeout performed, Emergency Drugs available, Suction available and Patient being monitored Patient Re-evaluated:Patient Re-evaluated prior to inductionOxygen Delivery Method: Circle system utilized Preoxygenation: Pre-oxygenation with 100% oxygen Intubation Type: IV induction Ventilation: Mask ventilation without difficulty and Oral airway inserted - appropriate to patient size Laryngoscope Size: Mac and 3 Grade View: Grade I Tube type: Oral Tube size: 7.5 mm Number of attempts: 1 Airway Equipment and Method: Stylet Secured at: 22 cm Tube secured with: Tape Dental Injury: Teeth and Oropharynx as per pre-operative assessment

## 2013-05-01 NOTE — Transfer of Care (Signed)
Immediate Anesthesia Transfer of Care Note  Patient: Victoria Gaines  Procedure(s) Performed: Procedure(s): INTRAMEDULLARY (IM) NAIL INTERTROCHANTRIC (Left)  Patient Location: PACU  Anesthesia Type:General  Level of Consciousness: sedated  Airway & Oxygen Therapy: Patient Spontanous Breathing and Patient connected to nasal cannula oxygen  Post-op Assessment: Report given to PACU RN and Post -op Vital signs reviewed and stable  Post vital signs: Reviewed and stable  Complications: No apparent anesthesia complications

## 2013-05-01 NOTE — Evaluation (Signed)
Physical Therapy Evaluation Patient Details Name: Victoria Gaines MRN: [id number] DOB: [birth year] Today's Date: 05/01/2013 Time: 4818-5631 PT Time Calculation (min): 18 min  PT Assessment / Plan / Recommendation History of Present Illness  Victoria Gaines is a 68 y.o.-year-old female who was involved in a mechanical fall and sustained a left hip fracture (pertrochanteric-type).  S/p IM implant Lt hip   Clinical Impression  Patient is s/p Lt IM implant for treatment of pertrochanteric fx surgery resulting in functional limitations due to the deficits listed below (see PT Problem List). Patient will benefit from skilled PT to increase their independence and safety with mobility to allow discharge to the venue listed below. Pt very motivated to progress with therapy. Daughter present and supportive; reports she will stay with pt at her apartment as long as needed upon acute D/C.     PT Assessment  Patient needs continued PT services    Follow Up Recommendations  Home health PT;Supervision/Assistance - 24 hour    Does the patient have the potential to tolerate intense rehabilitation      Barriers to Discharge        Equipment Recommendations  Rolling walker with 5" wheels;3in1 (PT)    Recommendations for Other Services OT consult   Frequency Min 4X/week    Precautions / Restrictions Precautions Precautions: Fall Restrictions Weight Bearing Restrictions: Yes   Pertinent Vitals/Pain 3/10 after activity; ice pack for pain given; patient repositioned for comfort       Mobility  Bed Mobility Overal bed mobility: Needs Assistance Bed Mobility: Supine to Sit Supine to sit: Min assist;HOB elevated General bed mobility comments: (A) to bring Lt LE to/off EOB; cues for hand placement and sequencing  Transfers Overall transfer level: Needs assistance Equipment used: Rolling walker (2 wheeled) Transfers: Sit to/from [facility] Sit to Stand: Mod assist Stand pivot transfers:  Mod assist General transfer comment: (A) to achieve upright standing position and maintain PWB status; cues for hand placement and sequencing; limited to SPT today due to nausea  Ambulation/Gait General Gait Details: limited to SPT today secondary to nausea     Exercises General Exercises - Lower Extremity Ankle Circles/Pumps: AROM;Both;10 reps;Seated   PT Diagnosis: Difficulty walking;Generalized weakness;Acute pain  PT Problem List: Decreased strength;Decreased range of motion;Decreased activity tolerance;Decreased balance;Decreased mobility;Decreased knowledge of use of DME;Decreased safety awareness;Decreased knowledge of precautions;Pain;Impaired sensation PT Treatment Interventions: DME instruction;Gait training;Functional mobility training;Therapeutic activities;Therapeutic exercise;Balance training;Neuromuscular re-education;Patient/family education     PT Goals(Current goals can be found in the care plan section) Acute Rehab PT Goals Patient Stated Goal: to get home PT Goal Formulation: With patient Time For Goal Achievement: 05/15/13 Potential to Achieve Goals: Good  Visit Information  Last PT Received On: 05/01/13 History of Present Illness: Victoria Gaines is a 68 y.o.-year-old female who was involved in a mechanical fall and sustained a left hip fracture (pertrochanteric-type).  S/p IM implant Lt hip        Prior Functioning  Home Living Family/patient expects to be discharged to:: Private residence Living Arrangements: Children Available Help at Discharge: Family;Available 24 hours/day Type of Home: Apartment Home Access: Level entry Home Layout: One level Home Equipment: None Prior Function Level of Independence: Independent Communication Communication: No difficulties Dominant Hand: Right    Cognition  Cognition Arousal/Alertness: Awake/alert Behavior During Therapy: WFL for tasks assessed/performed Overall Cognitive Status: Within Functional Limits for tasks  assessed    Extremity/Trunk Assessment Upper Extremity Assessment Upper Extremity Assessment: Defer to OT evaluation Lower Extremity Assessment Lower  Extremity Assessment: LLE deficits/detail LLE Deficits / Details: decr to light touch due to surgery; grossly 3/5  LLE: Unable to fully assess due to pain LLE Sensation: decreased light touch Cervical / Trunk Assessment Cervical / Trunk Assessment: Normal   Balance Balance Overall balance assessment: Needs assistance;History of Falls Sitting-balance support: Feet supported;Single extremity supported Sitting balance-Leahy Scale: Good Sitting balance - Comments: sat EOB ~5 min Standing balance support: During functional activity;Bilateral upper extremity supported Standing balance-Leahy Scale: Fair Standing balance comment: bil UE supported by RW  End of Session PT - End of Session Equipment Utilized During Treatment: Gait belt;Oxygen (2L) Activity Tolerance: Patient tolerated treatment well Patient left: in chair;with call bell/phone within reach;with family/visitor present Nurse Communication: Mobility status;Weight bearing status (pt c/o nausea)  GP     Gustavus Bryant, [place] [phone] 05/01/2013, 3:35 PM

## 2013-05-01 NOTE — Op Note (Signed)
   Date of Surgery: 05/01/2013  INDICATIONS: Victoria Gaines is a 68 y.o.-year-old female who was involved in a mechanical fall and sustained a left hip fracture (pertrochanteric-type). The risks and benefits of the procedure discussed with the patient prior to the procedure and all questions were answered; consent was obtained.  PREOPERATIVE DIAGNOSIS: left hip fracture (pertrochanteric-type)   POSTOPERATIVE DIAGNOSIS: Same   PROCEDURE: Treatment of pertrochanteric fracture with intramedullary implant. CPT [phone]   SURGEON: N. Eduard Roux, M.D.   ANESTHESIA: general   IV FLUIDS AND URINE: See anesthesia record   ESTIMATED BLOOD LOSS: 300 cc  IMPLANTS: [employer] recon nail   DRAINS: None.   COMPLICATIONS: None.   DESCRIPTION OF PROCEDURE: The patient was brought to the operating room and placed supine on the operating table. The patient's leg had been signed prior to the procedure. The patient had the anesthesia placed by the anesthesiologist. The prep verification and incision time-outs were performed to confirm that this was the correct patient, site, side and location. The patient had an SCD on the opposite lower extremity. The patient did receive antibiotics prior to the incision and was re-dosed during the procedure as needed at indicated intervals. The patient was positioned on the fracture table with the table in traction and internal rotation to reduce the hip. The well leg was placed in a hemi-lithotomy position and all bony prominences were well-padded. The patient had the lower extremity prepped and draped in the standard surgical fashion. The incision was made 4 finger breadths superior to the greater trochanter. A guide pin was inserted into the tip of the greater trochanter under fluoroscopic guidance. An opening reamer was used to gain access to the femoral canal.  The canal was sequentially reamed until there was adequate chatter.  The nail length was measured and inserted down  the femoral canal to its proper depth. The appropriate version of insertion for the lag screw was found under fluoroscopy. A pin was inserted up the femoral neck through the jig. Then, a second pin was inserted superior to the first pin. The length of the screws were then measured. The inferior screw was first placed followed by the superior screw. The wound was copiously irrigated with saline and the subcutaneous layer closed with 2.0 vicryl and the skin was reapproximated with staples. The wounds were cleaned and dried a final time and a sterile dressing was placed. The hip was taken through a range of motion at the end of the case under fluoroscopic imaging to visualize the approach-withdraw phenomenon and confirm implant length in the head. The patient was then awakened from anesthesia and taken to the recovery room in stable condition. All counts were correct at the end of the case.   POSTOPERATIVE PLAN: The patient will be 50% partial weight bearing and will return in 2 weeks for staple removal and the patient will receive DVT prophylaxis based on other medications, activity level, and risk ratio of bleeding to thrombosis.   Victoria Cecil, MD [place] 9:34 AM

## 2013-05-01 NOTE — Progress Notes (Addendum)
[facility] PROGRESS NOTE  Victoria Gaines WUJ:[id number] DOB: 1945-05-29 DOA: 04/30/2013 PCP: Glenda Chroman., MD  Assessment/Plan: 68 y.o. female with Past medical history of more disorder, hypothyroidism active smoker presented with mechanical fall, L hip fracture.  1. Fall/mechnical L hip fracture;  S/p IM implant 2/1. Doing well postop. Resume lovenox when ok with Dr. Erlinda Hong  2. Leukocytosis resolved.   3. Hypothyroidism Continue Synthroid  4. Mood disorder Continue Xanax and Effexor   5. Active smoker Counseled to quit smoking Nicotine patch  Code Status: full Family Communication: multiple at bedside Disposition Plan: await PT eval   Consultants:  ortho  Procedures:  IM implant  Antibiotics:    HPI/Subjective: No N/V. Pain controlled  Objective: Filed Vitals:   05/01/13 0537  BP: 124/63  Pulse: 76  Temp: 98.6 F (37 C)  Resp: 18    Intake/Output Summary (Last 24 hours) at 05/01/13 0749 Last data filed at 05/01/13 0731  Gross per 24 hour  Intake   2193 ml  Output    900 ml  Net   1293 ml   There were no vitals filed for this visit.  Exam:   General:  Slightly groggy. Oriented and appropriate  Cardiovascular: s1,s2 rrr  Respiratory: CTA BL  Abdomen: soft, nt, nd  Musculoskeletal: no LE edema. Pulses palpable Data Reviewed: Basic Metabolic Panel:  Recent Labs Lab 04/30/13 0319  NA 144  K 4.8  CL 108  CO2 24  GLUCOSE 123*  BUN 12  CREATININE 0.68  CALCIUM 9.1   Liver Function Tests: No results found for this basename: AST, ALT, ALKPHOS, BILITOT, PROT, ALBUMIN,  in the last 168 hours No results found for this basename: LIPASE, AMYLASE,  in the last 168 hours No results found for this basename: AMMONIA,  in the last 168 hours CBC:  Recent Labs Lab 04/30/13 0319 05/01/13 0449  WBC 14.8* 10.0  NEUTROABS 12.9*  --   HGB 14.5 12.6  HCT 42.4 38.9  MCV 100.7* 102.9*  PLT 244 221   Cardiac Enzymes: No results found for this  basename: CKTOTAL, CKMB, CKMBINDEX, TROPONINI,  in the last 168 hours BNP (last 3 results) No results found for this basename: PROBNP,  in the last 8760 hours CBG: No results found for this basename: GLUCAP,  in the last 168 hours  Recent Results (from the past 240 hour(s))  SURGICAL PCR SCREEN     Status: Abnormal   Collection Time    04/30/13  8:19 AM      Result Value Range Status   MRSA, PCR INVALID RESULTS, SPECIMEN SENT FOR CULTURE (*) NEGATIVE Final   Staphylococcus aureus INVALID RESULTS, SPECIMEN SENT FOR CULTURE (*) NEGATIVE Final   Comment:            The Xpert SA Assay (FDA     approved for NASAL specimens     in patients over 79 years of age),     is one component of     a comprehensive surveillance     program.  Test performance has     been validated by [employer] for patients greater     than or equal to 44 year old.     It is not intended     to diagnose infection nor to     guide or monitor treatment.     Studies: Dg Chest 1 View  04/30/2013   CLINICAL DATA:  Fall, hip pain.  EXAM: CHEST -  1 VIEW  COMPARISON:  Chest radiograph July 21, 2008  FINDINGS: Cardiomediastinal silhouette is unremarkable. The lungs are clear without pleural effusions or focal consolidations. Pulmonary vasculature is unremarkable. Trachea projects midline and there is no pneumothorax. Soft tissue planes and included osseous structures are nonsuspicious.  IMPRESSION: No acute cardiopulmonary process.   Electronically Signed   By: Elon Alas   On: 04/30/2013 04:50   Dg Hip Complete Left  04/30/2013   CLINICAL DATA:  Fall, hip pain.  EXAM: LEFT HIP - COMPLETE 2+ VIEW  COMPARISON:  None available for comparison at time of study interpretation.  FINDINGS: Comminuted impacted left intertrochanteric femur fracture with varus angulation distal bony fragment. Femoral head is located. No dislocation. No destructive bony lesions. Periarticular soft tissue planes are nonsuspicious.  Phleboliths in the pelvis.  IMPRESSION: Comminuted impacted displaced left hip femur intertrochanteric fracture without dislocation.   Electronically Signed   By: Elon Alas   On: 04/30/2013 04:49   Dg Femur Left  04/30/2013   CLINICAL DATA:  Fracture.  EXAM: LEFT FEMUR - 2 VIEW  COMPARISON:  Pelvic and hip radiographs April 30, 2013 at 4:21 a.m.  FINDINGS: Comminuted left femur intertrochanteric fracture partially imaged, no distal femur fracture. No destructive bony lesions. No knee dislocation. Soft tissue planes are nonsuspicious.  IMPRESSION: Partially imaged femur intertrochanteric fracture with no additional femur fractures.   Electronically Signed   By: Elon Alas   On: 04/30/2013 06:59    Scheduled Meds: .  ceFAZolin (ANCEF) IV  2 g Intravenous On Call to OR  . chlorhexidine  1 application Topical Once  . chlorhexidine  60 mL Topical Once  . enoxaparin (LOVENOX) injection  40 mg Subcutaneous Q24H  . FLUoxetine  20 mg Oral Daily  . levothyroxine  88 mcg Oral QAC breakfast  . nicotine  14 mg Transdermal Daily  . senna  1 tablet Oral BID  . venlafaxine XR  150 mg Oral Q breakfast   Continuous Infusions: . sodium chloride Stopped (05/01/13 0731)  . sodium chloride 100 mL/hr at 04/30/13 0715  . sodium chloride     Time spent: >15 minutes   [facility] Pager [phone]. If 7PM-7AM, please contact night-coverage at [email or web address], password [facility] 05/01/2013, 7:49 AM  LOS: 1 day

## 2013-05-01 NOTE — Anesthesia Postprocedure Evaluation (Signed)
  Anesthesia Post-op Note  Patient: Victoria Gaines  Procedure(s) Performed: Procedure(s): INTRAMEDULLARY (IM) NAIL INTERTROCHANTRIC (Left)  Patient Location: PACU  Anesthesia Type:General  Level of Consciousness: awake  Airway and Oxygen Therapy: Patient Spontanous Breathing  Post-op Pain: mild  Post-op Assessment: Post-op Vital signs reviewed  Post-op Vital Signs: Reviewed  Complications: No apparent anesthesia complications

## 2013-05-01 NOTE — Preoperative (Signed)
Beta Blockers   Reason not to administer Beta Blockers:Not Applicable 

## 2013-05-01 NOTE — Anesthesia Preprocedure Evaluation (Addendum)
Anesthesia Evaluation  Patient identified by MRN, date of birth, ID band Patient awake    Reviewed: Allergy & Precautions, H&P , NPO status , Patient's Chart, lab work & pertinent test results  Airway Mallampati: II TM Distance: >3 FB Neck ROM: Full    Dental  (+) Edentulous Upper and Dental Advisory Given   Pulmonary Current Smoker,          Cardiovascular negative cardio ROS  Rhythm:Regular Rate:Normal     Neuro/Psych Anxiety    GI/Hepatic negative GI ROS,   Endo/Other  Hypothyroidism   Renal/GU negative Renal ROS     Musculoskeletal   Abdominal   Peds  Hematology   Anesthesia Other Findings   Reproductive/Obstetrics                          Anesthesia Physical Anesthesia Plan  ASA: II  Anesthesia Plan:    Post-op Pain Management:    Induction:   Airway Management Planned: Oral ETT  Additional Equipment:   Intra-op Plan:   Post-operative Plan: Extubation in OR  Informed Consent: I have reviewed the patients History and Physical, chart, labs and discussed the procedure including the risks, benefits and alternatives for the proposed anesthesia with the patient or authorized representative who has indicated his/her understanding and acceptance.   Dental advisory given  Plan Discussed with: CRNA, Anesthesiologist and Surgeon  Anesthesia Plan Comments:         Anesthesia Quick Evaluation

## 2013-05-02 ENCOUNTER — Encounter (HOSPITAL_COMMUNITY): Payer: Self-pay | Admitting: Orthopaedic Surgery

## 2013-05-02 LAB — CBC
HCT: 32.6 % — ABNORMAL LOW (ref 36.0–46.0)
HEMOGLOBIN: 10.7 g/dL — AB (ref 12.0–15.0)
MCH: 33.6 pg (ref 26.0–34.0)
MCHC: 32.8 g/dL (ref 30.0–36.0)
MCV: 102.5 fL — ABNORMAL HIGH (ref 78.0–100.0)
Platelets: 178 10*3/uL (ref 150–400)
RBC: 3.18 MIL/uL — ABNORMAL LOW (ref 3.87–5.11)
RDW: 13 % (ref 11.5–15.5)
WBC: 12 10*3/uL — ABNORMAL HIGH (ref 4.0–10.5)

## 2013-05-02 LAB — BASIC METABOLIC PANEL
BUN: 9 mg/dL (ref 6–23)
CO2: 23 mEq/L (ref 19–32)
Calcium: 7.9 mg/dL — ABNORMAL LOW (ref 8.4–10.5)
Chloride: 106 mEq/L (ref 96–112)
Creatinine, Ser: 0.56 mg/dL (ref 0.50–1.10)
Glucose, Bld: 122 mg/dL — ABNORMAL HIGH (ref 70–99)
POTASSIUM: 3.8 meq/L (ref 3.7–5.3)
SODIUM: 139 meq/L (ref 137–147)

## 2013-05-02 LAB — VITAMIN D 25 HYDROXY (VIT D DEFICIENCY, FRACTURES): VIT D 25 HYDROXY: 15 ng/mL — AB (ref 30–89)

## 2013-05-02 MED ORDER — ASPIRIN EC 325 MG PO TBEC: 325.0000 mg | DELAYED_RELEASE_TABLET | Freq: Two times a day (BID) | ORAL | Status: DC

## 2013-05-02 MED ORDER — OXYCODONE HCL 5 MG PO TABS: 5.0000 mg | ORAL_TABLET | ORAL | Status: DC | PRN

## 2013-05-02 NOTE — Progress Notes (Signed)
Utilization review completed.  

## 2013-05-02 NOTE — Care Management Note (Signed)
CARE MANAGEMENT NOTE 05/02/2013  Patient:  [facility]   Account Number:  [id number]  Date Initiated:  05/02/2013  Documentation initiated by:  Ricki Miller  Subjective/Objective Assessment:   68 yr old female s/p left hip IM Nailing, s/p fall     Action/Plan:   Case manager spoke with patient concerning home health and DME needs at discharge. Choice offered. CM called referral to [place].   Anticipated DC Date:  05/03/2013   Anticipated DC Plan:  [place] [facility]  CM consult      [place]   Choice offered to / List presented to:  C-1 Patient   DME arranged  3-N-1  [facility] Moselle      DME agency  [place] arranged  [place].   Status of service:  Completed, signed off Medicare Important Message given?   (If response is "NO", the following Medicare IM given date fields will be blank) Date Medicare IM given:   Date Additional Medicare IM given:    Discharge Disposition:  [place]

## 2013-05-02 NOTE — Progress Notes (Addendum)
[facility] PROGRESS NOTE  Victoria Gaines ZOX:[id number] DOB: 1945-12-20 DOA: 04/30/2013 PCP: Glenda Chroman., MD  Assessment/Plan: 68 y.o. female with Past medical history of mood disorder, hypothyroidism active smoker presented with mechanical fall, L hip fracture.  1. Fall/mechnical L hip fracture;  S/p IM implant 2/1. Doing well postop. DVT prophylaxis per ortho. Home with PT, OT when OK with ortho. D/c IVF  3. Hypothyroidism Continue Synthroid  4. Mood disorder Continue Xanax and Effexor   5. Active smoker Counseled to quit smoking Nicotine patch  Code Status: full Family Communication: multiple at bedside Disposition Plan: await PT eval   Consultants:  ortho  Procedures:  IM implant  HPI/Subjective: No N/V. Pain controlled  Objective: Filed Vitals:   05/02/13 1600  BP:   Pulse:   Temp:   Resp: 18    Intake/Output Summary (Last 24 hours) at 05/02/13 1642 Last data filed at 05/02/13 1500  Gross per 24 hour  Intake 3024.58 ml  Output   1400 ml  Net 1624.58 ml   There were no vitals filed for this visit.  Exam:   General:  Alert, oriented, comfortable  Cardiovascular: s1,s2 rrr  Respiratory: CTA BL  Abdomen: soft, nt, nd  Musculoskeletal: trace LE edema. Pulses palpable Data Reviewed: Basic Metabolic Panel:  Recent Labs Lab 04/30/13 0319 05/02/13 0544  NA 144 139  K 4.8 3.8  CL 108 106  CO2 24 23  GLUCOSE 123* 122*  BUN 12 9  CREATININE 0.68 0.56  CALCIUM 9.1 7.9*   Liver Function Tests: No results found for this basename: AST, ALT, ALKPHOS, BILITOT, PROT, ALBUMIN,  in the last 168 hours No results found for this basename: LIPASE, AMYLASE,  in the last 168 hours No results found for this basename: AMMONIA,  in the last 168 hours CBC:  Recent Labs Lab 04/30/13 0319 05/01/13 0449 05/02/13 0544  WBC 14.8* 10.0 12.0*  NEUTROABS 12.9*  --   --   HGB 14.5 12.6 10.7*  HCT 42.4 38.9 32.6*  MCV 100.7* 102.9* 102.5*  PLT 244 221  178   Cardiac Enzymes: No results found for this basename: CKTOTAL, CKMB, CKMBINDEX, TROPONINI,  in the last 168 hours BNP (last 3 results) No results found for this basename: PROBNP,  in the last 8760 hours CBG: No results found for this basename: GLUCAP,  in the last 168 hours  Recent Results (from the past 240 hour(s))  SURGICAL PCR SCREEN     Status: Abnormal   Collection Time    04/30/13  8:19 AM      Result Value Range Status   MRSA, PCR INVALID RESULTS, SPECIMEN SENT FOR CULTURE (*) NEGATIVE Final   Staphylococcus aureus INVALID RESULTS, SPECIMEN SENT FOR CULTURE (*) NEGATIVE Final   Comment:            The Xpert SA Assay (FDA     approved for NASAL specimens     in patients over 36 years of age),     is one component of     a comprehensive surveillance     program.  Test performance has     been validated by [employer] for patients greater     than or equal to 34 year old.     It is not intended     to diagnose infection nor to     guide or monitor treatment.  MRSA CULTURE     Status: None   Collection Time  04/30/13  8:19 AM      Result Value Range Status   Specimen Description NASAL SWAB   Final   Special Requests NONE   Final   Culture     Final   Value: NO SUSPICIOUS COLONIES, CONTINUING TO HOLD     Performed at [facility]   Report Status PENDING   Incomplete  SURGICAL PCR SCREEN     Status: None   Collection Time    05/01/13  5:45 AM      Result Value Range Status   MRSA, PCR NEGATIVE  NEGATIVE Final   Staphylococcus aureus NEGATIVE  NEGATIVE Final   Comment:            The Xpert SA Assay (FDA     approved for NASAL specimens     in patients over 86 years of age),     is one component of     a comprehensive surveillance     program.  Test performance has     been validated by [employer] for patients greater     than or equal to 109 year old.     It is not intended     to diagnose infection nor to     guide or monitor  treatment.     Studies: Dg Femur Left  05/01/2013   CLINICAL DATA:  Status post ORIF of a proximal left femur fracture.  EXAM: LEFT FEMUR - 2 VIEW  COMPARISON:  04/30/2013  FINDINGS: The intertrochanteric fracture of the left proximal femur has been reduced with a long intra medullary rod and 2 screws. The major fracture fragments are and near-anatomic alignment with no significant angulation. Orthopedic hardware is well-seated. There is no new fracture or evidence of an operative complication.  IMPRESSION: Well aligned major fracture fragments following ORIF of the left proximal femur intertrochanteric fracture.   Electronically Signed   By: Lajean Manes M.D.   On: 05/01/2013 09:35   Dg C-arm 1-60 Min-no Report  05/01/2013   CLINICAL DATA: LEFT HIP FRACTURE   C-ARM 1-60 MINUTES  Fluoroscopy was utilized by the requesting physician.  No radiographic  interpretation.     Scheduled Meds: . aspirin EC  325 mg Oral BID  . FLUoxetine  20 mg Oral Daily  . levothyroxine  88 mcg Oral QAC breakfast  . nicotine  14 mg Transdermal Daily  . senna  1 tablet Oral BID  . venlafaxine XR  150 mg Oral Q breakfast   Continuous Infusions:   Time spent: >15 minutes   [facility] Pager [phone]. If 7PM-7AM, please contact night-coverage at [email or web address], password [facility] 05/02/2013, 4:42 PM  LOS: 2 days

## 2013-05-02 NOTE — Progress Notes (Signed)
   Subjective:  Patient reports pain as mild.  No events  Objective:   VITALS:   Filed Vitals:   05/01/13 1530 05/01/13 1630 05/01/13 2300 05/02/13 0500  BP: 102/60 110/60 100/53   Pulse: 80 81 82 88  Temp: 98.5 F (36.9 C) 98.3 F (36.8 C) 98.7 F (37.1 C) 98.3 F (36.8 C)  TempSrc: Oral Oral Oral Oral  Resp: 18  17 17   SpO2: 97% 96% 96%     Neurologically intact Neurovascular intact Sensation intact distally Intact pulses distally Dorsiflexion/Plantar flexion intact Incision: dressing C/D/I and no drainage No cellulitis present Compartment soft   Lab Results  Component Value Date   WBC 12.0* 05/02/2013   HGB 10.7* 05/02/2013   HCT 32.6* 05/02/2013   MCV 102.5* 05/02/2013   PLT 178 05/02/2013     Assessment/Plan: 1 Day Post-Op   Problem List Items Addressed This Visit     Cardiovascular and Mediastinum   Hypotension   Relevant Medications      aspirin EC tablet      aspirin EC tablet 325 mg      aspirin EC tablet     Endocrine   Hypothyroidism   Relevant Medications      levothyroxine (SYNTHROID, LEVOTHROID) 88 MCG tablet      levothyroxine (SYNTHROID, LEVOTHROID) tablet 88 mcg     Musculoskeletal and Integument   Hip fracture   Relevant Orders      Partial weight bearing      Partial weight bearing     Other   Mood disorder   Smoker    Other Visit Diagnoses   Fracture of hip, left, closed    -  Primary    Relevant Orders       Partial weight bearing       Partial weight bearing       Expected postop acute blood loss anemia - will monitor for symptoms Up with PT/OT DVT ppx - SCDs, ambulation, asa 50% PWB left lower extremity Pain control Discharge planning   Marianna Payment 05/02/2013, 8:20 AM [phone]

## 2013-05-02 NOTE — Progress Notes (Signed)
Physical Therapy Treatment Victoria Details Name: Victoria Gaines MRN: [id number] DOB: Nov 02, 1945 Today's Date: 05/02/2013 Time: 6945-0388 PT Time Calculation (min): 36 min  PT Assessment / Plan / Recommendation  History of Present Illness Victoria Gaines is a 68 y.o.-year-old female who was involved in a mechanical fall and sustained a left Gaines fracture (pertrochanteric-type).  S/p IM implant Victoria Gaines    PT Comments   Making quite good progress with mobility, especially with progressive ambulation; On track for dc home with prn daughter assist                                                                                                                                     Follow Up Recommendations  Home health PT;Supervision/Assistance - 24 hour     Does the Victoria have the potential to tolerate intense rehabilitation     Barriers to Discharge        Equipment Recommendations  Rolling walker with 5" wheels;3in1 (PT)    Recommendations for Other Services OT consult  Frequency Min 4X/week   Progress towards PT Goals Progress towards PT goals: Progressing toward goals  Plan Current plan remains appropriate    Precautions / Restrictions Precautions Precautions: Fall Restrictions Weight Bearing Restrictions: Yes LLE Weight Bearing: Partial weight bearing LLE Partial Weight Bearing Percentage or Pounds: 50   Pertinent Vitals/Pain 4-5/10 L Gaines with mobility Victoria repositioned for comfort     Mobility  Bed Mobility Overal bed mobility: Needs Assistance Bed Mobility: Supine to Sit Supine to sit: Min assist General bed mobility comments: (A) to bring Victoria LE to/off EOB; cues for hand placement and sequencing  Transfers Overall transfer level: Needs assistance Equipment used: Rolling walker (2 wheeled) Transfers: Sit to/from Stand Sit to Stand: Min assist General transfer comment: (A) to achieve upright standing position and maintain PWB status; cues for hand placement and  sequencing Ambulation/Gait Ambulation/Gait assistance: Min guard Ambulation Distance (Feet): 75 Feet Assistive device: Rolling walker (2 wheeled) Gait Pattern/deviations: Step-to pattern;Antalgic General Gait Details: Verbal and demo cues for gait sequence, and how to maintain PWBing by rearing weight through arms; Overall maintained PWB quite well; arms tired post walk    Exercises General Exercises - Lower Extremity Ankle Circles/Pumps: AROM;Both;10 reps Quad Sets: AROM;Left;10 reps Heel Slides: AAROM;Left;5 reps Gaines ABduction/ADduction: AAROM;Left;5 reps   PT Diagnosis:    PT Problem List:   PT Treatment Interventions:     PT Goals (current goals can now be found in the care plan section) Acute Rehab PT Goals Victoria Gaines: to get home PT Gaines Formulation: With Victoria Time For Gaines Achievement: 05/15/13 Potential to Achieve Goals: Good  Visit Information  Last PT Received On: 05/02/13 Assistance Needed: +1 History of Present Illness: Victoria Gaines is a 68 y.o.-year-old female who was involved in a mechanical fall and sustained a left Gaines fracture (pertrochanteric-type).  S/p IM implant Victoria Gaines  Subjective Data  Subjective: Seemed pleased with her performance today Victoria Gaines: to get home   Cognition  Cognition Arousal/Alertness: Awake/alert Behavior During Therapy: WFL for tasks assessed/performed Overall Cognitive Status: Within Functional Limits for tasks assessed    Balance  Balance Overall balance assessment: Needs assistance Standing balance support: Bilateral upper extremity supported;During functional activity Standing balance-Leahy Scale: Good  End of Session PT - End of Session Equipment Utilized During Treatment: Gait belt Activity Tolerance: Victoria tolerated treatment well Victoria left: in chair;with call bell/phone within reach Nurse Communication: Mobility status   GP     Roney Marion [facility] [place], [place]  05/02/2013, 10:57 AM

## 2013-05-02 NOTE — Evaluation (Signed)
Occupational Therapy Evaluation and Discharge Patient Details Name: Victoria Gaines MRN: [id number] DOB: July 16, 1945 Today's Date: 05/02/2013 Time: 5916-3846 OT Time Calculation (min): 17 min  OT Assessment / Plan / Recommendation History of present illness Victoria Gaines is a 68 y.o.-year-old female who was involved in a mechanical fall and sustained a left hip fracture (pertrochanteric-type).  S/p IM implant Lt hip    Clinical Impression   This 68 yo female admitted and underwent above presents to acute OT with all education completed, will benefit from [facility] due to decreased use of LLE, increased pain LLE, decreased mobility of LLE all affecting pt's ability to take care of herself. Acute OT will sign off.   OT Assessment  All further OT needs can be met in the next venue of care    Follow Up Recommendations  Home health OT       Equipment Recommendations  3 in 1 bedside comode          Precautions / Restrictions Precautions Precautions: Fall Restrictions Weight Bearing Restrictions: Yes LLE Weight Bearing: Partial weight bearing LLE Partial Weight Bearing Percentage or Pounds: 50   Pertinent Vitals/Pain 5/10 LLE with getting from recliner to bed    ADL  Eating/Feeding: Independent Where Assessed - Eating/Feeding: Chair Grooming: Set up Where Assessed - Grooming: Supported standing Upper Body Bathing: Set up Where Assessed - Upper Body Bathing: Supported sitting Lower Body Bathing: Maximal assistance Where Assessed - Lower Body Bathing: Supported sit to stand Upper Body Dressing: Set up Where Assessed - Upper Body Dressing: Supported sit to stand Lower Body Dressing: +1 Total assistance Where Assessed - Lower Body Dressing: Supported sit to Lobbyist: Psychiatric nurse Method: Arts development officer:  (Recliner>bed) Toileting - Water quality scientist and Hygiene: Minimal assistance Where Assessed - Best boy and  Hygiene: Sit to stand from 3-in-1 or toilet Equipment Used: Gait belt;Rolling walker Transfers/Ambulation Related to ADLs: Min A sit<>stand and stand pivot ADL Comments: Pt reports that family will A her with LB B/D until she can do it for herself. I talked with her about a tub transfer bench and gave her a handout if she decides she wants to get one on her own so that she knows how to do the transfer    OT Diagnosis: Generalized weakness;Acute pain  OT Problem List: Decreased strength;Decreased range of motion;Decreased knowledge of use of DME or AE;Pain;Impaired balance (sitting and/or standing)    Acute Rehab OT Goals Patient Stated Goal: to go home  Visit Information  Last OT Received On: 05/02/13 Assistance Needed: +1 History of Present Illness: Victoria Gaines is a 68 y.o.-year-old female who was involved in a mechanical fall and sustained a left hip fracture (pertrochanteric-type).  S/p IM implant Lt hip        Prior Functioning     Home Living Family/patient expects to be discharged to:: Private residence Living Arrangements: Children Available Help at Discharge: Family;Available 24 hours/day Type of Home: Apartment Home Access: Level entry Home Layout: One level Home Equipment: None Prior Function Level of Independence: Independent Communication Communication: No difficulties Dominant Hand: Right         Vision/Perception Vision - History Patient Visual Report: No change from baseline   Cognition  Cognition Arousal/Alertness: Awake/alert Behavior During Therapy: WFL for tasks assessed/performed Overall Cognitive Status: Within Functional Limits for tasks assessed    Extremity/Trunk Assessment Upper Extremity Assessment Upper Extremity Assessment: Overall WFL for tasks assessed     Mobility Bed  Mobility Overal bed mobility: Needs Assistance Bed Mobility: Sit to Supine Sit to supine: Min assist (for LEs) Overall transfer level: Needs assistance Equipment  used: Rolling walker (2 wheeled) Transfers: Sit to/from [facility] Sit to Stand: Min assist Stand pivot transfers: Min assist           End of Session OT - End of Session Equipment Utilized During Treatment: Gait belt;Rolling walker Activity Tolerance: Patient limited by fatigue;Patient limited by pain Patient left: in bed;with call bell/phone within reach       Almon Register [phone] 05/02/2013, 1:59 PM

## 2013-05-03 DIAGNOSIS — J441 Chronic obstructive pulmonary disease with (acute) exacerbation: Secondary | ICD-10-CM

## 2013-05-03 LAB — BASIC METABOLIC PANEL
BUN: 9 mg/dL (ref 6–23)
CO2: 25 meq/L (ref 19–32)
CREATININE: 0.5 mg/dL (ref 0.50–1.10)
Calcium: 8.4 mg/dL (ref 8.4–10.5)
Chloride: 103 mEq/L (ref 96–112)
GFR calc Af Amer: >90 mL/min (ref >90)
GFR calc non Af Amer: >90 mL/min (ref >90)
Glucose, Bld: 108 mg/dL — ABNORMAL HIGH (ref 70–99)
Potassium: 3.7 mEq/L (ref 3.7–5.3)
Sodium: 138 mEq/L (ref 137–147)

## 2013-05-03 LAB — CBC
HCT: 29.9 % — ABNORMAL LOW (ref 36.0–46.0)
Hemoglobin: 9.9 g/dL — ABNORMAL LOW (ref 12.0–15.0)
MCH: 33.4 pg (ref 26.0–34.0)
MCHC: 33.1 g/dL (ref 30.0–36.0)
MCV: 101 fL — AB (ref 78.0–100.0)
Platelets: 179 10*3/uL (ref 150–400)
RBC: 2.96 MIL/uL — ABNORMAL LOW (ref 3.87–5.11)
RDW: 12.9 % (ref 11.5–15.5)
WBC: 10.8 10*3/uL — ABNORMAL HIGH (ref 4.0–10.5)

## 2013-05-03 LAB — MRSA CULTURE

## 2013-05-03 MED ORDER — POLYETHYLENE GLYCOL 3350 17 G PO PACK: 17.0000 g | PACK | Freq: Every day | ORAL | Status: DC | PRN

## 2013-05-03 NOTE — Progress Notes (Signed)
Patient ambulated 150 ft with assist on RA:  Patient saturation at rest on RA = 93%.  Patient saturation on RA while ambulating = 94%.  Patient saturation on RA at rest = 88% post-ambulation.

## 2013-05-03 NOTE — Progress Notes (Signed)
Physical Therapy Treatment Patient Details Name: Victoria Gaines MRN: [id number] DOB: 03-19-1946 Today's Date: 05/03/2013 Time: 5573-2202 PT Time Calculation (min): 57 min  PT Assessment / Plan / Recommendation  History of Present Illness Victoria Gaines is a 68 y.o.-year-old female who was involved in a mechanical fall and sustained a left hip fracture (pertrochanteric-type).  S/p IM implant Lt hip    PT Comments   Making good progress with functional mobility; Discussed need to go to bedside commode first when getting up due to urgent need to void when getting up (pt reports this is typical)  See also previous PT note re: O2 saturations with amb; Notified Dr. Eliseo Squires  OK for dc home with daughter assist from a PT standpoint   Follow Up Recommendations  Home health PT;Supervision/Assistance - 24 hour [place] and HHRN as well     Does the patient have the potential to tolerate intense rehabilitation     Barriers to Discharge        Equipment Recommendations  Other (comment) (Equipment delivered to room)    Recommendations for Other Services    Frequency Min 4X/week   Progress towards PT Goals Progress towards PT goals: Progressing toward goals  Plan Current plan remains appropriate    Precautions / Restrictions Precautions Precautions: Fall Restrictions LLE Weight Bearing: Partial weight bearing LLE Partial Weight Bearing Percentage or Pounds: 50 Other Position/Activity Restrictions: Watch O2 sats with amb   Pertinent Vitals/Pain 3/10 L hip patient repositioned for comfort     Mobility  Bed Mobility Overal bed mobility: Needs Assistance Bed Mobility: Supine to Sit Supine to sit: Min guard General bed mobility comments: (A) to bring Lt LE to/off EOB; cues for hand placement and sequencing  Transfers Overall transfer level: Needs assistance Equipment used: Rolling walker (2 wheeled) Transfers: Sit to/from Stand Sit to Stand: Min guard General transfer comment: Cues for LE  positioning, and hand placement Ambulation/Gait Ambulation/Gait assistance: Supervision Ambulation Distance (Feet): 150 Feet Assistive device: Rolling walker (2 wheeled) Gait Pattern/deviations: Step-to pattern;Step-through pattern Gait velocity: slowed Gait velocity interpretation: Below normal speed for age/gender General Gait Details: Verbal and demo cues for gait sequence, and how to maintain PWBing by rearing weight through arms; Overall maintained PWB quite well; arms tired post walk; adjusted RW for optimal fit    Exercises     PT Diagnosis:    PT Problem List:   PT Treatment Interventions:     PT Goals (current goals can now be found in the care plan section) Acute Rehab PT Goals Patient Stated Goal: to go home PT Goal Formulation: With patient Time For Goal Achievement: 05/15/13 Potential to Achieve Goals: Good  Visit Information  Last PT Received On: 05/03/13 Assistance Needed: +1 History of Present Illness: Victoria Gaines is a 68 y.o.-year-old female who was involved in a mechanical fall and sustained a left hip fracture (pertrochanteric-type).  S/p IM implant Lt hip     Subjective Data  Subjective: Happy with her ability to walk; says she'll try to use this hospital stay a a jumping off point to quit smoking Patient Stated Goal: to go home   Cognition  Cognition Arousal/Alertness: Awake/alert Behavior During Therapy: WFL for tasks assessed/performed Overall Cognitive Status: Within Functional Limits for tasks assessed    Balance     End of Session PT - End of Session Equipment Utilized During Treatment: Oxygen Activity Tolerance: Patient tolerated treatment well Patient left: in chair;with call bell/phone within reach Nurse Communication: Mobility status;Other (comment) (O2 sats  low on Room Air)   GP     Roney Marion [place], [place]  05/03/2013, 10:33 AM

## 2013-05-03 NOTE — Care Management Note (Signed)
CARE MANAGEMENT NOTE 05/03/2013  Patient:  [facility]   Account Number:  [id number]  Date Initiated:  05/02/2013  Documentation initiated by:  Ricki Miller  Subjective/Objective Assessment:   68 yr old female s/p left hip IM Nailing, s/p fall     Action/Plan:   Case manager spoke with patient concerning home health and DME needs at discharge. Choice offered. CM called referral to [place].   Anticipated DC Date:  05/03/2013   Anticipated DC Plan:  [place] Planning Services  CM consult      [facility] Choice  HOME HEALTH  DURABLE MEDICAL EQUIPMENT   Choice offered to / List presented to:  C-1 Patient   DME arranged  3-N-1  [place]      DME agency  [place] arranged  HH-2 PT  HH-3 OT  HH-1 RN  HH-10 DISEASE MANAGEMENT      [place].   Status of service:  Completed, signed off Discharge Disposition:  [place]   Patient will require oxygen @2L  [place] .

## 2013-05-03 NOTE — Progress Notes (Signed)
Physical Therapy Treatment Note  SATURATION QUALIFICATIONS: (This note is used to comply with regulatory documentation for home oxygen)  Patient Saturations on Room Air at Rest = 87%-91%  Patient Saturations on Room Air while Ambulating = 85% (observed lowest)  Patient Saturations on 2 Liters of oxygen while Ambulating = 92%  Please briefly explain why patient needs home oxygen: Required supplemental O2 during ambulation to keep O2 sats at acceptable levels.  Full note to follow;  Thanks,   [place], [place]

## 2013-05-03 NOTE — Progress Notes (Signed)
Subjective:  Patient reports pain as mild.  No events  Objective:   VITALS:   Filed Vitals:   05/02/13 1401 05/02/13 1600 05/03/13 0132 05/03/13 0631  BP: 104/64  106/65 102/70  Pulse: 89  85 82  Temp: 97.8 F (36.6 C)  98.2 F (36.8 C) 98.7 F (37.1 C)  TempSrc: Oral     Resp: 20 18 18 18   SpO2: 98%  99% 99%    Neurologically intact Neurovascular intact Sensation intact distally Intact pulses distally Dorsiflexion/Plantar flexion intact Incision: dressing C/D/I and no drainage No cellulitis present Compartment soft   Lab Results  Component Value Date   WBC 10.8* 05/03/2013   HGB 9.9* 05/03/2013   HCT 29.9* 05/03/2013   MCV 101.0* 05/03/2013   PLT 179 05/03/2013     Assessment/Plan: 2 Days Post-Op   Problem List Items Addressed This Visit     Cardiovascular and Mediastinum   Hypotension   Relevant Medications      aspirin EC tablet      aspirin EC tablet 325 mg      aspirin EC tablet     Endocrine   Hypothyroidism   Relevant Medications      levothyroxine (SYNTHROID, LEVOTHROID) 88 MCG tablet      levothyroxine (SYNTHROID, LEVOTHROID) tablet 88 mcg     Musculoskeletal and Integument   Hip fracture   Relevant Orders      Partial weight bearing      Partial weight bearing     Other   Mood disorder   Smoker    Other Visit Diagnoses   Fracture of hip, left, closed    -  Primary    Relevant Orders       Partial weight bearing       Partial weight bearing       Expected postop acute blood loss anemia - will monitor for symptoms Up with PT/OT DVT ppx - SCDs, ambulation, asa 50% PWB left lower extremity Pain control Discharge planning - home with HHPT and 24h assist   Marianna Payment 05/03/2013, 8:04 AM [phone]

## 2013-05-03 NOTE — Discharge Summary (Addendum)
Physician Discharge Summary  Victoria Gaines [id number] DOB: 08-06-1945 DOA: 04/30/2013  PCP: Glenda Chroman., MD  Admit date: 04/30/2013 Discharge date: 05/03/2013  Time spent: 35 minutes  Recommendations for Outpatient Follow-up:  1. Outpatient PFTs- STOP SMOKING   Discharge Diagnoses:  Principal Problem:   Closed left hip fracture Active Problems:   Hypothyroidism   Mood disorder   Hypotension   Smoker   Discharge Condition: improved  Diet recommendation: cardiac  There were no vitals filed for this visit.  History of present illness:  Victoria Gaines is a 68 y.o. female with Past medical history of more disorder, hypothyroidism active smoker.  The patient is coming from home.  Patient mentions that today when she was walking in her house she tripped over her coffee table and fell on the ground hitting her left side. She denies any injury to her neck or head or anywhere else. She denies any chest pain, fever, dizziness, lightheadedness, vertigo, focal neurological deficit. She also denies any shortness of breath or diarrhea constipation nausea or vomiting. She denies any burning urination.  She denies any changes in her medication.  At the time of my evaluation she mentions her pain is well-controlled.  She was initially hypotensive in the ED but her blood pressure improved after bolus   Hospital Course:  Fall/mechnical L hip fracture;  S/p IM implant 2/1. Doing well postop. DVT prophylaxis per ortho. Home with PT, OT   prob COPD due to tobacco abuse -will need PFTs outpatient O2 sats > 90% RA and ambulations    Hypothyroidism Continue Synthroid    Mood disorder Continue Xanax and Effexor   Active smoker Counseled to quit smoking Nicotine patch   Procedures:  L hip nailing  Consultations:  ortho  Discharge Exam: Filed Vitals:   05/03/13 0631  BP: 102/70  Pulse: 82  Temp: 98.7 F (37.1 C)  Resp: 18    General: A+Ox3 Cardiovascular: rrr Respiratory:  clear, no wheezing  Discharge Instructions  Discharge Orders   Future Orders Complete By Expires   Diet - low sodium heart healthy  As directed    Discharge instructions  As directed    Comments:     Home health with 24 hour care CBC 1 week re hgb   Increase activity slowly  As directed    Partial weight bearing  As directed    Questions:     % Body Weight:  50   Laterality:  left   Extremity:  Lower   Partial weight bearing  As directed    Questions:     % Body Weight:  50   Laterality:  left   Extremity:  Lower       Medication List         ALPRAZolam 0.5 MG tablet  Commonly known as:  XANAX  Take 0.5 mg by mouth daily as needed for anxiety.     aspirin EC 325 MG tablet  Take 1 tablet (325 mg total) by mouth 2 (two) times daily.     FLUoxetine 20 MG capsule  Commonly known as:  PROZAC  Take 20 mg by mouth daily.     levothyroxine 88 MCG tablet  Commonly known as:  SYNTHROID, LEVOTHROID  Take 88 mcg by mouth daily before breakfast.     oxyCODONE 5 MG immediate release tablet  Commonly known as:  Oxy IR/ROXICODONE  Take 1-3 tablets (5-15 mg total) by mouth every 4 (four) hours as needed.     polyethylene glycol  packet  Commonly known as:  MIRALAX / GLYCOLAX  Take 17 g by mouth daily as needed for mild constipation.     venlafaxine XR 150 MG 24 hr capsule  Commonly known as:  EFFEXOR-XR  Take 150 mg by mouth daily with breakfast.       Allergies  Allergen Reactions  . Macrodantin [Nitrofurantoin Macrocrystal] Rash       Follow-up Information   Follow up with Marianna Payment, MD In 2 weeks.   Specialty:  Orthopedic Surgery   Contact information:   [address] [place] [address] [phone]       Follow up with VYAS,DHRUV B., MD In 1 week.   Specialty:  Internal Medicine   Contact information:   [place] [id number] [phone]        The results of significant diagnostics from this hospitalization  (including imaging, microbiology, ancillary and laboratory) are listed below for reference.    Significant Diagnostic Studies: Dg Chest 1 View  04/30/2013   CLINICAL DATA:  Fall, hip pain.  EXAM: CHEST - 1 VIEW  COMPARISON:  Chest radiograph July 21, 2008  FINDINGS: Cardiomediastinal silhouette is unremarkable. The lungs are clear without pleural effusions or focal consolidations. Pulmonary vasculature is unremarkable. Trachea projects midline and there is no pneumothorax. Soft tissue planes and included osseous structures are nonsuspicious.  IMPRESSION: No acute cardiopulmonary process.   Electronically Signed   By: Elon Alas   On: 04/30/2013 04:50   Dg Hip Complete Left  04/30/2013   CLINICAL DATA:  Fall, hip pain.  EXAM: LEFT HIP - COMPLETE 2+ VIEW  COMPARISON:  None available for comparison at time of study interpretation.  FINDINGS: Comminuted impacted left intertrochanteric femur fracture with varus angulation distal bony fragment. Femoral head is located. No dislocation. No destructive bony lesions. Periarticular soft tissue planes are nonsuspicious. Phleboliths in the pelvis.  IMPRESSION: Comminuted impacted displaced left hip femur intertrochanteric fracture without dislocation.   Electronically Signed   By: Elon Alas   On: 04/30/2013 04:49   Dg Femur Left  05/01/2013   CLINICAL DATA:  Status post ORIF of a proximal left femur fracture.  EXAM: LEFT FEMUR - 2 VIEW  COMPARISON:  04/30/2013  FINDINGS: The intertrochanteric fracture of the left proximal femur has been reduced with a long intra medullary rod and 2 screws. The major fracture fragments are and near-anatomic alignment with no significant angulation. Orthopedic hardware is well-seated. There is no new fracture or evidence of an operative complication.  IMPRESSION: Well aligned major fracture fragments following ORIF of the left proximal femur intertrochanteric fracture.   Electronically Signed   By: Lajean Manes M.D.   On:  05/01/2013 09:35   Dg Femur Left  04/30/2013   CLINICAL DATA:  Fracture.  EXAM: LEFT FEMUR - 2 VIEW  COMPARISON:  Pelvic and hip radiographs April 30, 2013 at 4:21 a.m.  FINDINGS: Comminuted left femur intertrochanteric fracture partially imaged, no distal femur fracture. No destructive bony lesions. No knee dislocation. Soft tissue planes are nonsuspicious.  IMPRESSION: Partially imaged femur intertrochanteric fracture with no additional femur fractures.   Electronically Signed   By: Elon Alas   On: 04/30/2013 06:59   Dg C-arm 1-60 Min-no Report  05/01/2013   CLINICAL DATA: LEFT HIP FRACTURE   C-ARM 1-60 MINUTES  Fluoroscopy was utilized by the requesting physician.  No radiographic  interpretation.     Microbiology: Recent Results (from the past 240 hour(s))  SURGICAL  PCR SCREEN     Status: Abnormal   Collection Time    04/30/13  8:19 AM      Result Value Range Status   MRSA, PCR INVALID RESULTS, SPECIMEN SENT FOR CULTURE (*) NEGATIVE Final   Staphylococcus aureus INVALID RESULTS, SPECIMEN SENT FOR CULTURE (*) NEGATIVE Final   Comment:            The Xpert SA Assay (FDA     approved for NASAL specimens     in patients over 78 years of age),     is one component of     a comprehensive surveillance     program.  Test performance has     been validated by [employer] for patients greater     than or equal to 22 year old.     It is not intended     to diagnose infection nor to     guide or monitor treatment.  MRSA CULTURE     Status: None   Collection Time    04/30/13  8:19 AM      Result Value Range Status   Specimen Description NASAL SWAB   Final   Special Requests NONE   Final   Culture     Final   Value: NO SUSPICIOUS COLONIES, CONTINUING TO HOLD     Performed at [facility]   Report Status PENDING   Incomplete  SURGICAL PCR SCREEN     Status: None   Collection Time    05/01/13  5:45 AM      Result Value Range Status   MRSA, PCR NEGATIVE  NEGATIVE  Final   Staphylococcus aureus NEGATIVE  NEGATIVE Final   Comment:            The Xpert SA Assay (FDA     approved for NASAL specimens     in patients over 72 years of age),     is one component of     a comprehensive surveillance     program.  Test performance has     been validated by [employer] for patients greater     than or equal to 18 year old.     It is not intended     to diagnose infection nor to     guide or monitor treatment.     Labs: Basic Metabolic Panel:  Recent Labs Lab 04/30/13 0319 05/02/13 0544 05/03/13 0618  NA 144 139 138  K 4.8 3.8 3.7  CL 108 106 103  CO2 24 23 25   GLUCOSE 123* 122* 108*  BUN 12 9 9   CREATININE 0.68 0.56 0.50  CALCIUM 9.1 7.9* 8.4   Liver Function Tests: No results found for this basename: AST, ALT, ALKPHOS, BILITOT, PROT, ALBUMIN,  in the last 168 hours No results found for this basename: LIPASE, AMYLASE,  in the last 168 hours No results found for this basename: AMMONIA,  in the last 168 hours CBC:  Recent Labs Lab 04/30/13 0319 05/01/13 0449 05/02/13 0544 05/03/13 0618  WBC 14.8* 10.0 12.0* 10.8*  NEUTROABS 12.9*  --   --   --   HGB 14.5 12.6 10.7* 9.9*  HCT 42.4 38.9 32.6* 29.9*  MCV 100.7* 102.9* 102.5* 101.0*  PLT 244 221 178 179   Cardiac Enzymes: No results found for this basename: CKTOTAL, CKMB, CKMBINDEX, TROPONINI,  in the last 168 hours BNP: BNP (last 3 results) No results found  for this basename: PROBNP,  in the last 8760 hours CBG: No results found for this basename: GLUCAP,  in the last 168 hours     Signed:  Eulogio Bear  [facility] 05/03/2013, 8:47 AM

## 2013-05-03 NOTE — Discharge Instructions (Signed)
Home Health Physical therapy to be provided by [place] [phone]  1. May get incision wet after postoperative day 10 2. Change dressings as needed

## 2013-05-03 NOTE — Plan of Care (Addendum)
Problem: Discharge Progression Outcomes Goal: Barriers To Progression Addressed/Resolved Outcome: Completed/Met Date Met:  05/03/13 Home oxygen set up. -Home O2 Dc'd - see note.

## 2015-03-05 IMAGING — CR DG FEMUR 2V*L*
3 series · 3 of 3 positions shown · non-contrast
Comparison: Pelvic and hip radiographs April 30, 2013 at [DATE]
a.m.

CLINICAL DATA: Fracture.

EXAM:
LEFT FEMUR - 2 VIEW

[x femur distal ap left]
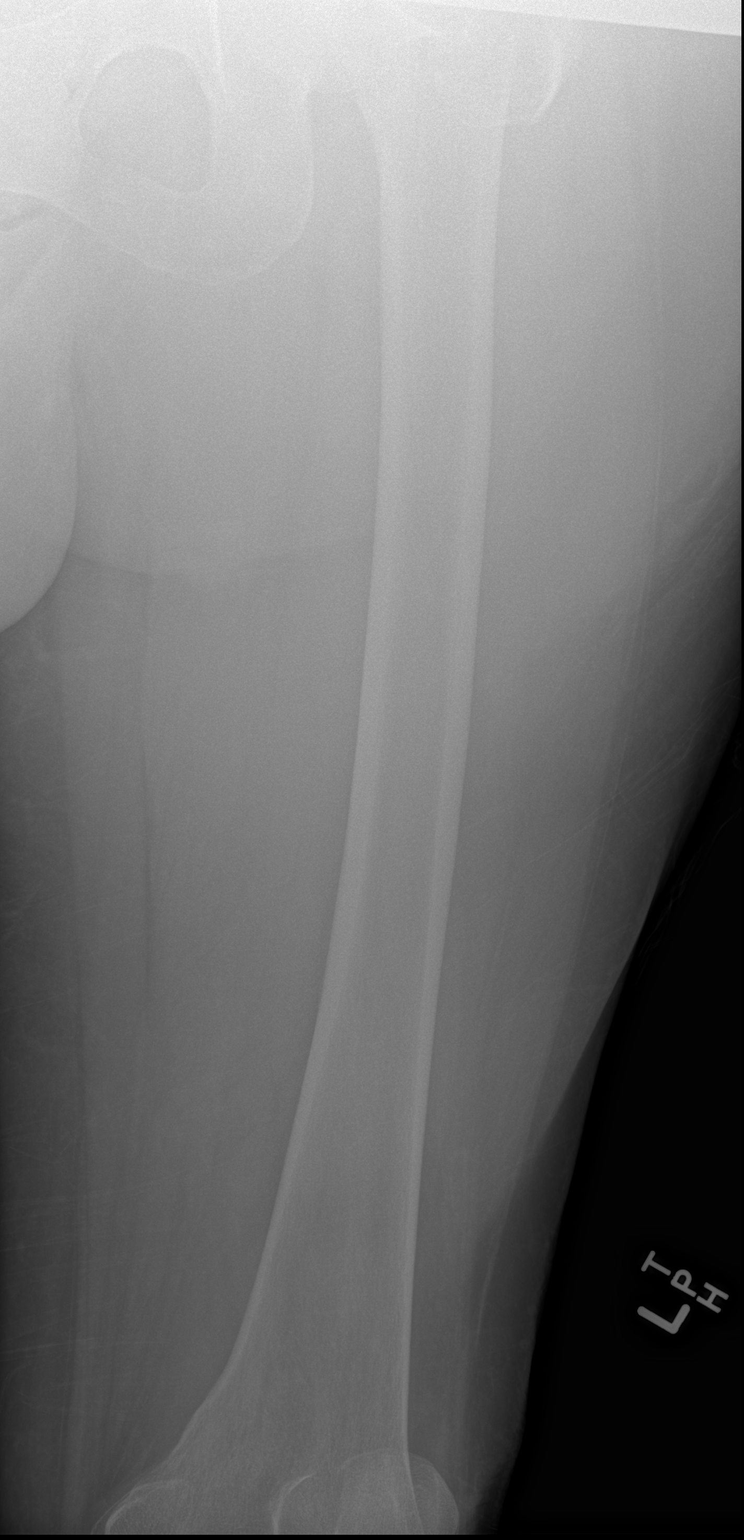

[x femur distal lat left (1 of 2)]
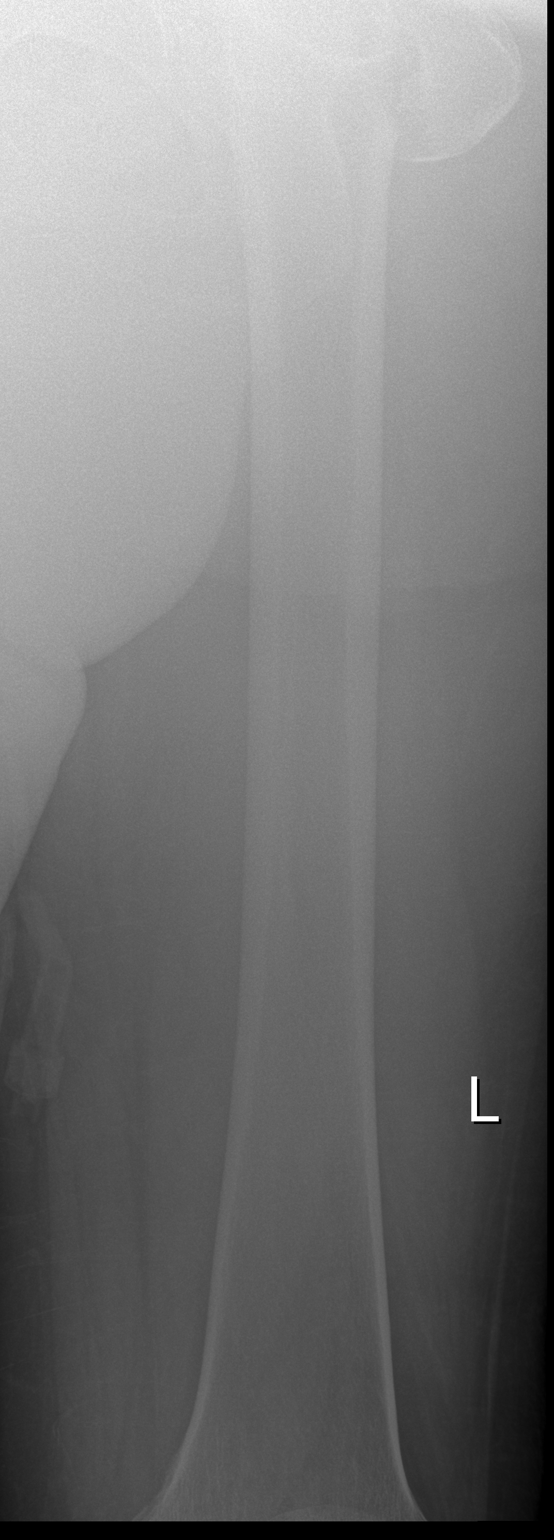

[x femur distal lat left (2 of 2)]
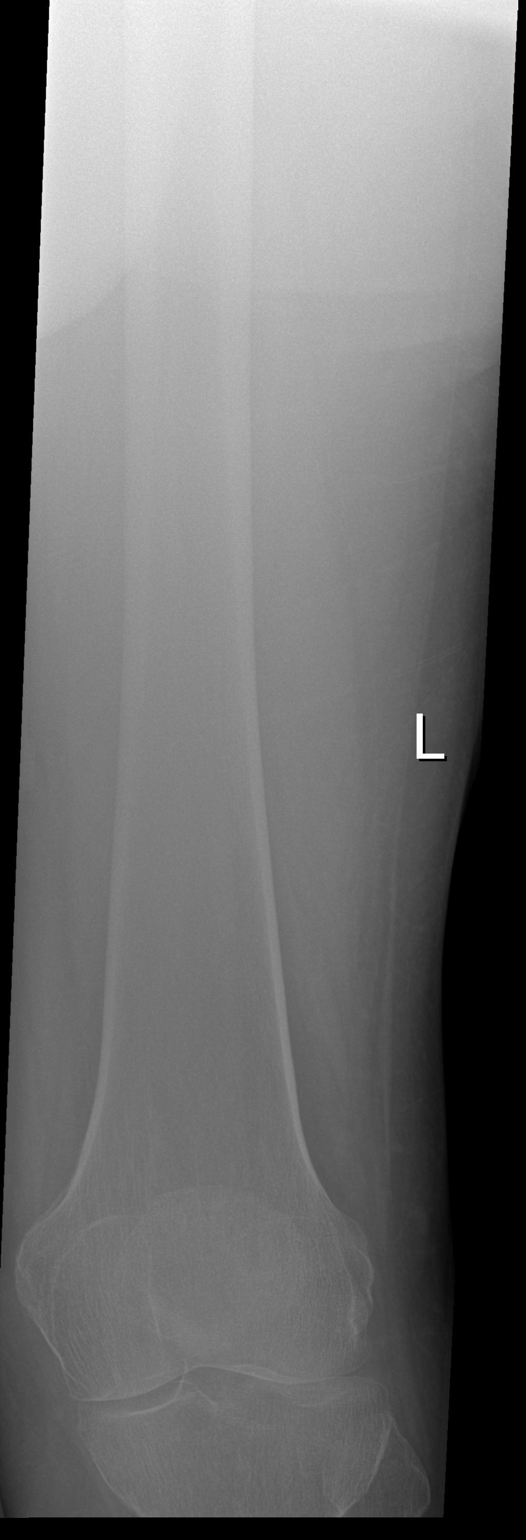

[3 of 3 positions shown; findings below may reference images not displayed]

FINDINGS: Comminuted left femur intertrochanteric fracture partially imaged,
no distal femur fracture. No destructive bony lesions. No knee
dislocation. Soft tissue planes are nonsuspicious.
IMPRESSION: Partially imaged femur intertrochanteric fracture with no additional
femur fractures.

  By: Eng-Yousef Nigm

## 2015-03-05 IMAGING — CR DG CHEST 1V
1 series · 1 of 1 positions shown · non-contrast
Comparison: Chest radiograph July 21, 2008

CLINICAL DATA: Fall, hip pain.

EXAM:
CHEST - 1 VIEW

[t chest supine]
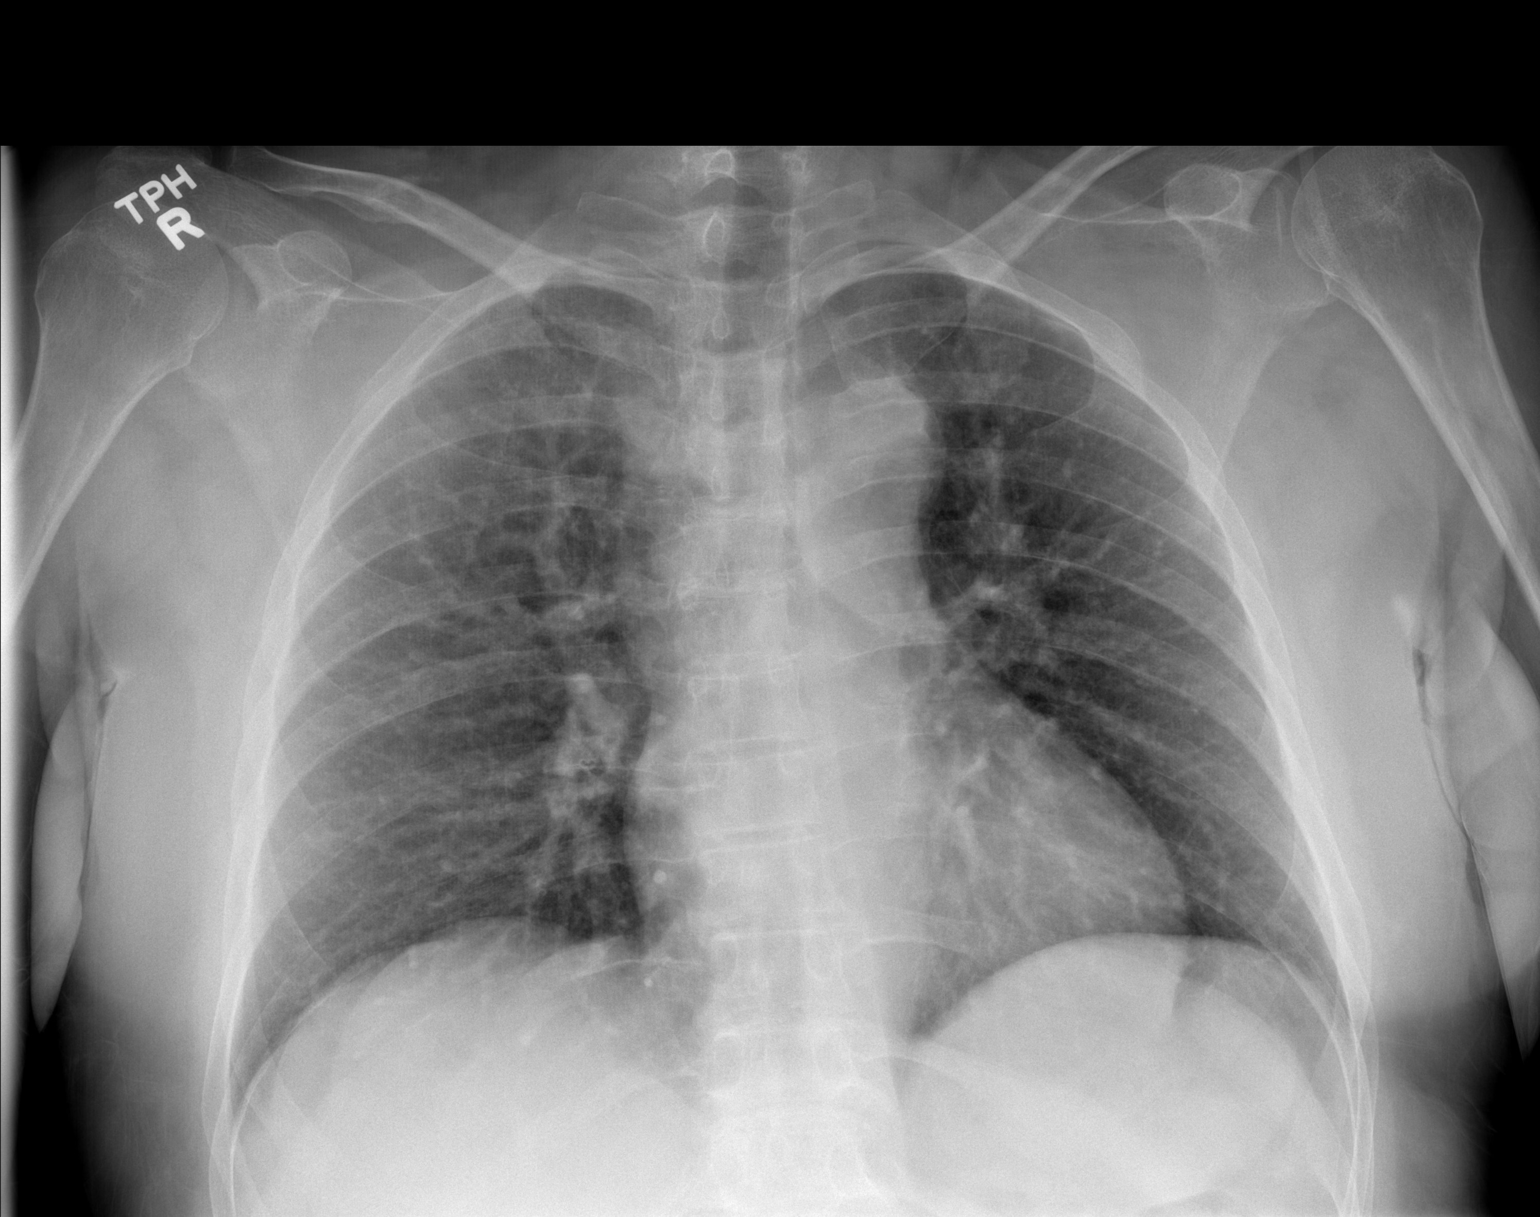

[1 of 1 positions shown; findings below may reference images not displayed]

FINDINGS: Cardiomediastinal silhouette is unremarkable. The lungs are clear
without pleural effusions or focal consolidations. Pulmonary
vasculature is unremarkable. Trachea projects midline and there is
no pneumothorax. Soft tissue planes and included osseous structures
are nonsuspicious.
IMPRESSION: No acute cardiopulmonary process.

  By: Clemente Sternberg

## 2015-04-03 DIAGNOSIS — I10 Essential (primary) hypertension: Secondary | ICD-10-CM | POA: Diagnosis not present

## 2015-04-03 DIAGNOSIS — F17200 Nicotine dependence, unspecified, uncomplicated: Secondary | ICD-10-CM | POA: Diagnosis not present

## 2015-04-03 DIAGNOSIS — N390 Urinary tract infection, site not specified: Secondary | ICD-10-CM | POA: Diagnosis not present

## 2015-04-03 DIAGNOSIS — Z720 Tobacco use: Secondary | ICD-10-CM | POA: Diagnosis not present

## 2015-05-28 DIAGNOSIS — R5383 Other fatigue: Secondary | ICD-10-CM | POA: Diagnosis not present

## 2015-05-28 DIAGNOSIS — Z418 Encounter for other procedures for purposes other than remedying health state: Secondary | ICD-10-CM | POA: Diagnosis not present

## 2015-05-28 DIAGNOSIS — R42 Dizziness and giddiness: Secondary | ICD-10-CM | POA: Diagnosis not present

## 2015-05-28 DIAGNOSIS — Z1389 Encounter for screening for other disorder: Secondary | ICD-10-CM | POA: Diagnosis not present

## 2015-05-28 DIAGNOSIS — Z0000 Encounter for general adult medical examination without abnormal findings: Secondary | ICD-10-CM | POA: Diagnosis not present

## 2015-05-28 DIAGNOSIS — Z7189 Other specified counseling: Secondary | ICD-10-CM | POA: Diagnosis not present

## 2015-05-28 DIAGNOSIS — Z79899 Other long term (current) drug therapy: Secondary | ICD-10-CM | POA: Diagnosis not present

## 2015-05-28 DIAGNOSIS — Z1211 Encounter for screening for malignant neoplasm of colon: Secondary | ICD-10-CM | POA: Diagnosis not present

## 2015-06-07 DIAGNOSIS — L570 Actinic keratosis: Secondary | ICD-10-CM | POA: Diagnosis not present

## 2015-06-11 DIAGNOSIS — R05 Cough: Secondary | ICD-10-CM | POA: Diagnosis not present

## 2015-07-06 DIAGNOSIS — J449 Chronic obstructive pulmonary disease, unspecified: Secondary | ICD-10-CM | POA: Diagnosis not present

## 2015-07-06 DIAGNOSIS — L570 Actinic keratosis: Secondary | ICD-10-CM | POA: Diagnosis not present

## 2015-07-06 DIAGNOSIS — F17200 Nicotine dependence, unspecified, uncomplicated: Secondary | ICD-10-CM | POA: Diagnosis not present

## 2015-08-20 DIAGNOSIS — J449 Chronic obstructive pulmonary disease, unspecified: Secondary | ICD-10-CM | POA: Diagnosis not present

## 2015-08-20 DIAGNOSIS — F329 Major depressive disorder, single episode, unspecified: Secondary | ICD-10-CM | POA: Diagnosis not present

## 2015-09-03 DIAGNOSIS — M1990 Unspecified osteoarthritis, unspecified site: Secondary | ICD-10-CM | POA: Diagnosis not present

## 2015-09-03 DIAGNOSIS — J449 Chronic obstructive pulmonary disease, unspecified: Secondary | ICD-10-CM | POA: Diagnosis not present

## 2015-09-03 DIAGNOSIS — F329 Major depressive disorder, single episode, unspecified: Secondary | ICD-10-CM | POA: Diagnosis not present

## 2015-09-03 DIAGNOSIS — I10 Essential (primary) hypertension: Secondary | ICD-10-CM | POA: Diagnosis not present

## 2015-09-25 DIAGNOSIS — F329 Major depressive disorder, single episode, unspecified: Secondary | ICD-10-CM | POA: Diagnosis not present

## 2015-09-25 DIAGNOSIS — J449 Chronic obstructive pulmonary disease, unspecified: Secondary | ICD-10-CM | POA: Diagnosis not present

## 2015-11-12 ENCOUNTER — Ambulatory Visit (HOSPITAL_COMMUNITY): Payer: Self-pay | Admitting: Psychiatry

## 2015-11-12 ENCOUNTER — Encounter (HOSPITAL_COMMUNITY): Payer: Self-pay

## 2015-11-16 DIAGNOSIS — F329 Major depressive disorder, single episode, unspecified: Secondary | ICD-10-CM | POA: Diagnosis not present

## 2015-11-16 DIAGNOSIS — J449 Chronic obstructive pulmonary disease, unspecified: Secondary | ICD-10-CM | POA: Diagnosis not present

## 2016-01-04 DIAGNOSIS — J449 Chronic obstructive pulmonary disease, unspecified: Secondary | ICD-10-CM | POA: Diagnosis not present

## 2016-01-04 DIAGNOSIS — F329 Major depressive disorder, single episode, unspecified: Secondary | ICD-10-CM | POA: Diagnosis not present

## 2016-01-08 DIAGNOSIS — E039 Hypothyroidism, unspecified: Secondary | ICD-10-CM | POA: Diagnosis not present

## 2016-01-30 ENCOUNTER — Telehealth (HOSPITAL_COMMUNITY): Payer: Self-pay | Admitting: *Deleted

## 2016-01-30 NOTE — Telephone Encounter (Signed)
Called pt to sch new pt appt due to ref from Dr. Woody Seller. Spoke with pt and she stated she have decided she's not going to come. Per pt she do not feel like she need the services anymore.

## 2016-02-06 DIAGNOSIS — F329 Major depressive disorder, single episode, unspecified: Secondary | ICD-10-CM | POA: Diagnosis not present

## 2016-02-06 DIAGNOSIS — J449 Chronic obstructive pulmonary disease, unspecified: Secondary | ICD-10-CM | POA: Diagnosis not present

## 2016-03-05 DIAGNOSIS — J449 Chronic obstructive pulmonary disease, unspecified: Secondary | ICD-10-CM | POA: Diagnosis not present

## 2016-03-05 DIAGNOSIS — F329 Major depressive disorder, single episode, unspecified: Secondary | ICD-10-CM | POA: Diagnosis not present

## 2016-04-08 DIAGNOSIS — F329 Major depressive disorder, single episode, unspecified: Secondary | ICD-10-CM | POA: Diagnosis not present

## 2016-04-08 DIAGNOSIS — J449 Chronic obstructive pulmonary disease, unspecified: Secondary | ICD-10-CM | POA: Diagnosis not present

## 2016-06-10 DIAGNOSIS — F329 Major depressive disorder, single episode, unspecified: Secondary | ICD-10-CM | POA: Diagnosis not present

## 2016-06-10 DIAGNOSIS — J449 Chronic obstructive pulmonary disease, unspecified: Secondary | ICD-10-CM | POA: Diagnosis not present

## 2016-06-13 DIAGNOSIS — F329 Major depressive disorder, single episode, unspecified: Secondary | ICD-10-CM | POA: Diagnosis not present

## 2016-06-13 DIAGNOSIS — Z1389 Encounter for screening for other disorder: Secondary | ICD-10-CM | POA: Diagnosis not present

## 2016-06-13 DIAGNOSIS — Z1211 Encounter for screening for malignant neoplasm of colon: Secondary | ICD-10-CM | POA: Diagnosis not present

## 2016-06-13 DIAGNOSIS — Z299 Encounter for prophylactic measures, unspecified: Secondary | ICD-10-CM | POA: Diagnosis not present

## 2016-06-13 DIAGNOSIS — F17210 Nicotine dependence, cigarettes, uncomplicated: Secondary | ICD-10-CM | POA: Diagnosis not present

## 2016-06-13 DIAGNOSIS — F17200 Nicotine dependence, unspecified, uncomplicated: Secondary | ICD-10-CM | POA: Diagnosis not present

## 2016-06-13 DIAGNOSIS — F419 Anxiety disorder, unspecified: Secondary | ICD-10-CM | POA: Diagnosis not present

## 2016-06-13 DIAGNOSIS — I10 Essential (primary) hypertension: Secondary | ICD-10-CM | POA: Diagnosis not present

## 2016-06-13 DIAGNOSIS — Z713 Dietary counseling and surveillance: Secondary | ICD-10-CM | POA: Diagnosis not present

## 2016-06-13 DIAGNOSIS — J449 Chronic obstructive pulmonary disease, unspecified: Secondary | ICD-10-CM | POA: Diagnosis not present

## 2016-06-13 DIAGNOSIS — Z0000 Encounter for general adult medical examination without abnormal findings: Secondary | ICD-10-CM | POA: Diagnosis not present

## 2016-06-13 DIAGNOSIS — Z7189 Other specified counseling: Secondary | ICD-10-CM | POA: Diagnosis not present

## 2016-06-17 DIAGNOSIS — Z1231 Encounter for screening mammogram for malignant neoplasm of breast: Secondary | ICD-10-CM | POA: Diagnosis not present

## 2016-07-01 DIAGNOSIS — E039 Hypothyroidism, unspecified: Secondary | ICD-10-CM | POA: Diagnosis not present

## 2016-07-01 DIAGNOSIS — Z79899 Other long term (current) drug therapy: Secondary | ICD-10-CM | POA: Diagnosis not present

## 2016-07-01 DIAGNOSIS — R5383 Other fatigue: Secondary | ICD-10-CM | POA: Diagnosis not present

## 2016-07-01 DIAGNOSIS — I10 Essential (primary) hypertension: Secondary | ICD-10-CM | POA: Diagnosis not present

## 2016-07-08 DIAGNOSIS — E2839 Other primary ovarian failure: Secondary | ICD-10-CM | POA: Diagnosis not present

## 2016-08-07 DIAGNOSIS — F329 Major depressive disorder, single episode, unspecified: Secondary | ICD-10-CM | POA: Diagnosis not present

## 2016-08-07 DIAGNOSIS — J449 Chronic obstructive pulmonary disease, unspecified: Secondary | ICD-10-CM | POA: Diagnosis not present

## 2016-09-18 DIAGNOSIS — M810 Age-related osteoporosis without current pathological fracture: Secondary | ICD-10-CM | POA: Diagnosis not present

## 2016-09-18 DIAGNOSIS — I10 Essential (primary) hypertension: Secondary | ICD-10-CM | POA: Diagnosis not present

## 2016-09-18 DIAGNOSIS — Z299 Encounter for prophylactic measures, unspecified: Secondary | ICD-10-CM | POA: Diagnosis not present

## 2016-09-18 DIAGNOSIS — F17210 Nicotine dependence, cigarettes, uncomplicated: Secondary | ICD-10-CM | POA: Diagnosis not present

## 2016-09-18 DIAGNOSIS — J449 Chronic obstructive pulmonary disease, unspecified: Secondary | ICD-10-CM | POA: Diagnosis not present

## 2016-09-18 DIAGNOSIS — F329 Major depressive disorder, single episode, unspecified: Secondary | ICD-10-CM | POA: Diagnosis not present

## 2016-09-18 DIAGNOSIS — Z6827 Body mass index (BMI) 27.0-27.9, adult: Secondary | ICD-10-CM | POA: Diagnosis not present

## 2016-09-18 DIAGNOSIS — F419 Anxiety disorder, unspecified: Secondary | ICD-10-CM | POA: Diagnosis not present

## 2016-09-18 DIAGNOSIS — E039 Hypothyroidism, unspecified: Secondary | ICD-10-CM | POA: Diagnosis not present

## 2016-10-03 DIAGNOSIS — M810 Age-related osteoporosis without current pathological fracture: Secondary | ICD-10-CM | POA: Diagnosis not present

## 2016-11-28 DIAGNOSIS — E663 Overweight: Secondary | ICD-10-CM | POA: Diagnosis not present

## 2016-11-28 DIAGNOSIS — E039 Hypothyroidism, unspecified: Secondary | ICD-10-CM | POA: Diagnosis not present

## 2016-11-28 DIAGNOSIS — J309 Allergic rhinitis, unspecified: Secondary | ICD-10-CM | POA: Diagnosis not present

## 2016-11-28 DIAGNOSIS — F17210 Nicotine dependence, cigarettes, uncomplicated: Secondary | ICD-10-CM | POA: Diagnosis not present

## 2016-11-28 DIAGNOSIS — Z299 Encounter for prophylactic measures, unspecified: Secondary | ICD-10-CM | POA: Diagnosis not present

## 2016-11-28 DIAGNOSIS — R300 Dysuria: Secondary | ICD-10-CM | POA: Diagnosis not present

## 2016-11-28 DIAGNOSIS — E669 Obesity, unspecified: Secondary | ICD-10-CM | POA: Diagnosis not present

## 2016-12-08 DIAGNOSIS — I708 Atherosclerosis of other arteries: Secondary | ICD-10-CM | POA: Diagnosis not present

## 2016-12-08 DIAGNOSIS — R109 Unspecified abdominal pain: Secondary | ICD-10-CM | POA: Diagnosis not present

## 2016-12-08 DIAGNOSIS — M549 Dorsalgia, unspecified: Secondary | ICD-10-CM | POA: Diagnosis not present

## 2016-12-08 DIAGNOSIS — R935 Abnormal findings on diagnostic imaging of other abdominal regions, including retroperitoneum: Secondary | ICD-10-CM | POA: Diagnosis not present

## 2017-03-13 DIAGNOSIS — F329 Major depressive disorder, single episode, unspecified: Secondary | ICD-10-CM | POA: Diagnosis not present

## 2017-03-13 DIAGNOSIS — J449 Chronic obstructive pulmonary disease, unspecified: Secondary | ICD-10-CM | POA: Diagnosis not present

## 2017-04-24 DIAGNOSIS — M810 Age-related osteoporosis without current pathological fracture: Secondary | ICD-10-CM | POA: Diagnosis not present

## 2017-05-14 DIAGNOSIS — J449 Chronic obstructive pulmonary disease, unspecified: Secondary | ICD-10-CM | POA: Diagnosis not present

## 2017-05-14 DIAGNOSIS — F329 Major depressive disorder, single episode, unspecified: Secondary | ICD-10-CM | POA: Diagnosis not present

## 2017-05-29 DIAGNOSIS — J449 Chronic obstructive pulmonary disease, unspecified: Secondary | ICD-10-CM | POA: Diagnosis not present

## 2017-05-29 DIAGNOSIS — F329 Major depressive disorder, single episode, unspecified: Secondary | ICD-10-CM | POA: Diagnosis not present

## 2017-06-12 DIAGNOSIS — Z90710 Acquired absence of both cervix and uterus: Secondary | ICD-10-CM | POA: Diagnosis not present

## 2017-06-12 DIAGNOSIS — N200 Calculus of kidney: Secondary | ICD-10-CM | POA: Diagnosis not present

## 2017-06-12 DIAGNOSIS — R918 Other nonspecific abnormal finding of lung field: Secondary | ICD-10-CM | POA: Diagnosis not present

## 2017-06-12 DIAGNOSIS — Z7982 Long term (current) use of aspirin: Secondary | ICD-10-CM | POA: Diagnosis not present

## 2017-06-12 DIAGNOSIS — Z888 Allergy status to other drugs, medicaments and biological substances status: Secondary | ICD-10-CM | POA: Diagnosis not present

## 2017-06-12 DIAGNOSIS — Z79899 Other long term (current) drug therapy: Secondary | ICD-10-CM | POA: Diagnosis not present

## 2017-06-12 DIAGNOSIS — E039 Hypothyroidism, unspecified: Secondary | ICD-10-CM | POA: Diagnosis not present

## 2017-06-12 DIAGNOSIS — F329 Major depressive disorder, single episode, unspecified: Secondary | ICD-10-CM | POA: Diagnosis not present

## 2017-06-12 DIAGNOSIS — K3580 Unspecified acute appendicitis: Secondary | ICD-10-CM | POA: Diagnosis not present

## 2017-06-12 DIAGNOSIS — Z6826 Body mass index (BMI) 26.0-26.9, adult: Secondary | ICD-10-CM | POA: Diagnosis not present

## 2017-06-12 DIAGNOSIS — J449 Chronic obstructive pulmonary disease, unspecified: Secondary | ICD-10-CM | POA: Diagnosis not present

## 2017-06-12 DIAGNOSIS — R1031 Right lower quadrant pain: Secondary | ICD-10-CM | POA: Diagnosis not present

## 2017-06-12 DIAGNOSIS — F17200 Nicotine dependence, unspecified, uncomplicated: Secondary | ICD-10-CM | POA: Diagnosis not present

## 2017-06-12 DIAGNOSIS — R319 Hematuria, unspecified: Secondary | ICD-10-CM | POA: Diagnosis not present

## 2017-06-12 DIAGNOSIS — Z299 Encounter for prophylactic measures, unspecified: Secondary | ICD-10-CM | POA: Diagnosis not present

## 2017-06-12 DIAGNOSIS — R109 Unspecified abdominal pain: Secondary | ICD-10-CM | POA: Diagnosis not present

## 2017-06-12 DIAGNOSIS — R350 Frequency of micturition: Secondary | ICD-10-CM | POA: Diagnosis not present

## 2017-06-12 DIAGNOSIS — F17210 Nicotine dependence, cigarettes, uncomplicated: Secondary | ICD-10-CM | POA: Diagnosis not present

## 2017-06-12 DIAGNOSIS — Z7951 Long term (current) use of inhaled steroids: Secondary | ICD-10-CM | POA: Diagnosis not present

## 2017-06-12 DIAGNOSIS — R112 Nausea with vomiting, unspecified: Secondary | ICD-10-CM | POA: Diagnosis not present

## 2017-06-13 DIAGNOSIS — J449 Chronic obstructive pulmonary disease, unspecified: Secondary | ICD-10-CM | POA: Diagnosis not present

## 2017-06-13 DIAGNOSIS — F329 Major depressive disorder, single episode, unspecified: Secondary | ICD-10-CM | POA: Diagnosis not present

## 2017-06-13 DIAGNOSIS — K3580 Unspecified acute appendicitis: Secondary | ICD-10-CM | POA: Diagnosis not present

## 2017-06-13 DIAGNOSIS — R112 Nausea with vomiting, unspecified: Secondary | ICD-10-CM | POA: Diagnosis not present

## 2017-06-22 DIAGNOSIS — Z09 Encounter for follow-up examination after completed treatment for conditions other than malignant neoplasm: Secondary | ICD-10-CM | POA: Diagnosis not present

## 2017-06-22 DIAGNOSIS — E039 Hypothyroidism, unspecified: Secondary | ICD-10-CM | POA: Diagnosis not present

## 2017-06-22 DIAGNOSIS — Z299 Encounter for prophylactic measures, unspecified: Secondary | ICD-10-CM | POA: Diagnosis not present

## 2017-06-22 DIAGNOSIS — J449 Chronic obstructive pulmonary disease, unspecified: Secondary | ICD-10-CM | POA: Diagnosis not present

## 2017-06-22 DIAGNOSIS — F17210 Nicotine dependence, cigarettes, uncomplicated: Secondary | ICD-10-CM | POA: Diagnosis not present

## 2017-06-22 DIAGNOSIS — Z9049 Acquired absence of other specified parts of digestive tract: Secondary | ICD-10-CM | POA: Diagnosis not present

## 2017-06-22 DIAGNOSIS — Z6826 Body mass index (BMI) 26.0-26.9, adult: Secondary | ICD-10-CM | POA: Diagnosis not present

## 2017-06-22 DIAGNOSIS — F419 Anxiety disorder, unspecified: Secondary | ICD-10-CM | POA: Diagnosis not present

## 2017-06-22 DIAGNOSIS — I10 Essential (primary) hypertension: Secondary | ICD-10-CM | POA: Diagnosis not present

## 2017-07-01 DIAGNOSIS — H40053 Ocular hypertension, bilateral: Secondary | ICD-10-CM | POA: Diagnosis not present

## 2017-07-07 DIAGNOSIS — J449 Chronic obstructive pulmonary disease, unspecified: Secondary | ICD-10-CM | POA: Diagnosis not present

## 2017-07-07 DIAGNOSIS — F329 Major depressive disorder, single episode, unspecified: Secondary | ICD-10-CM | POA: Diagnosis not present

## 2017-07-13 DIAGNOSIS — Z299 Encounter for prophylactic measures, unspecified: Secondary | ICD-10-CM | POA: Diagnosis not present

## 2017-07-13 DIAGNOSIS — Z7189 Other specified counseling: Secondary | ICD-10-CM | POA: Diagnosis not present

## 2017-07-13 DIAGNOSIS — Z6827 Body mass index (BMI) 27.0-27.9, adult: Secondary | ICD-10-CM | POA: Diagnosis not present

## 2017-07-13 DIAGNOSIS — M810 Age-related osteoporosis without current pathological fracture: Secondary | ICD-10-CM | POA: Diagnosis not present

## 2017-07-13 DIAGNOSIS — Z79899 Other long term (current) drug therapy: Secondary | ICD-10-CM | POA: Diagnosis not present

## 2017-07-13 DIAGNOSIS — Z1331 Encounter for screening for depression: Secondary | ICD-10-CM | POA: Diagnosis not present

## 2017-07-13 DIAGNOSIS — Z1339 Encounter for screening examination for other mental health and behavioral disorders: Secondary | ICD-10-CM | POA: Diagnosis not present

## 2017-07-13 DIAGNOSIS — I340 Nonrheumatic mitral (valve) insufficiency: Secondary | ICD-10-CM | POA: Diagnosis not present

## 2017-07-13 DIAGNOSIS — J449 Chronic obstructive pulmonary disease, unspecified: Secondary | ICD-10-CM | POA: Diagnosis not present

## 2017-07-13 DIAGNOSIS — I10 Essential (primary) hypertension: Secondary | ICD-10-CM | POA: Diagnosis not present

## 2017-07-13 DIAGNOSIS — E039 Hypothyroidism, unspecified: Secondary | ICD-10-CM | POA: Diagnosis not present

## 2017-07-13 DIAGNOSIS — Z1211 Encounter for screening for malignant neoplasm of colon: Secondary | ICD-10-CM | POA: Diagnosis not present

## 2017-07-13 DIAGNOSIS — Z0000 Encounter for general adult medical examination without abnormal findings: Secondary | ICD-10-CM | POA: Diagnosis not present

## 2017-08-03 DIAGNOSIS — H25013 Cortical age-related cataract, bilateral: Secondary | ICD-10-CM | POA: Diagnosis not present

## 2017-08-03 DIAGNOSIS — H40013 Open angle with borderline findings, low risk, bilateral: Secondary | ICD-10-CM | POA: Diagnosis not present

## 2017-08-03 DIAGNOSIS — H35361 Drusen (degenerative) of macula, right eye: Secondary | ICD-10-CM | POA: Diagnosis not present

## 2017-08-03 DIAGNOSIS — H2512 Age-related nuclear cataract, left eye: Secondary | ICD-10-CM | POA: Diagnosis not present

## 2017-08-03 DIAGNOSIS — H2513 Age-related nuclear cataract, bilateral: Secondary | ICD-10-CM | POA: Diagnosis not present

## 2017-08-10 DIAGNOSIS — I361 Nonrheumatic tricuspid (valve) insufficiency: Secondary | ICD-10-CM | POA: Diagnosis not present

## 2017-08-14 ENCOUNTER — Encounter (INDEPENDENT_AMBULATORY_CARE_PROVIDER_SITE_OTHER): Payer: Medicare Other | Admitting: Ophthalmology

## 2017-08-14 DIAGNOSIS — H43813 Vitreous degeneration, bilateral: Secondary | ICD-10-CM

## 2017-08-14 DIAGNOSIS — H2513 Age-related nuclear cataract, bilateral: Secondary | ICD-10-CM

## 2017-08-14 DIAGNOSIS — H33302 Unspecified retinal break, left eye: Secondary | ICD-10-CM | POA: Diagnosis not present

## 2017-08-14 DIAGNOSIS — H35412 Lattice degeneration of retina, left eye: Secondary | ICD-10-CM

## 2017-08-28 ENCOUNTER — Encounter (INDEPENDENT_AMBULATORY_CARE_PROVIDER_SITE_OTHER): Payer: Medicare Other | Admitting: Ophthalmology

## 2017-08-28 DIAGNOSIS — H33302 Unspecified retinal break, left eye: Secondary | ICD-10-CM

## 2017-09-01 DIAGNOSIS — F329 Major depressive disorder, single episode, unspecified: Secondary | ICD-10-CM | POA: Diagnosis not present

## 2017-09-01 DIAGNOSIS — J449 Chronic obstructive pulmonary disease, unspecified: Secondary | ICD-10-CM | POA: Diagnosis not present

## 2017-09-29 DIAGNOSIS — H2511 Age-related nuclear cataract, right eye: Secondary | ICD-10-CM | POA: Diagnosis not present

## 2017-09-29 DIAGNOSIS — H25811 Combined forms of age-related cataract, right eye: Secondary | ICD-10-CM | POA: Diagnosis not present

## 2017-11-16 DIAGNOSIS — H25012 Cortical age-related cataract, left eye: Secondary | ICD-10-CM | POA: Diagnosis not present

## 2017-11-16 DIAGNOSIS — H2512 Age-related nuclear cataract, left eye: Secondary | ICD-10-CM | POA: Diagnosis not present

## 2017-12-10 DIAGNOSIS — J449 Chronic obstructive pulmonary disease, unspecified: Secondary | ICD-10-CM | POA: Diagnosis not present

## 2017-12-10 DIAGNOSIS — F329 Major depressive disorder, single episode, unspecified: Secondary | ICD-10-CM | POA: Diagnosis not present

## 2017-12-23 DIAGNOSIS — M810 Age-related osteoporosis without current pathological fracture: Secondary | ICD-10-CM | POA: Diagnosis not present

## 2017-12-25 DIAGNOSIS — J441 Chronic obstructive pulmonary disease with (acute) exacerbation: Secondary | ICD-10-CM | POA: Diagnosis not present

## 2017-12-25 DIAGNOSIS — Z299 Encounter for prophylactic measures, unspecified: Secondary | ICD-10-CM | POA: Diagnosis not present

## 2017-12-25 DIAGNOSIS — Z713 Dietary counseling and surveillance: Secondary | ICD-10-CM | POA: Diagnosis not present

## 2017-12-25 DIAGNOSIS — Z6826 Body mass index (BMI) 26.0-26.9, adult: Secondary | ICD-10-CM | POA: Diagnosis not present

## 2017-12-25 DIAGNOSIS — I10 Essential (primary) hypertension: Secondary | ICD-10-CM | POA: Diagnosis not present

## 2017-12-25 DIAGNOSIS — R05 Cough: Secondary | ICD-10-CM | POA: Diagnosis not present

## 2018-01-01 ENCOUNTER — Encounter (INDEPENDENT_AMBULATORY_CARE_PROVIDER_SITE_OTHER): Payer: Medicare Other | Admitting: Ophthalmology

## 2018-01-01 DIAGNOSIS — H33032 Retinal detachment with giant retinal tear, left eye: Secondary | ICD-10-CM

## 2018-01-01 DIAGNOSIS — H43813 Vitreous degeneration, bilateral: Secondary | ICD-10-CM | POA: Diagnosis not present

## 2018-01-01 DIAGNOSIS — H2512 Age-related nuclear cataract, left eye: Secondary | ICD-10-CM | POA: Diagnosis not present

## 2018-01-08 DIAGNOSIS — J449 Chronic obstructive pulmonary disease, unspecified: Secondary | ICD-10-CM | POA: Diagnosis not present

## 2018-01-08 DIAGNOSIS — F329 Major depressive disorder, single episode, unspecified: Secondary | ICD-10-CM | POA: Diagnosis not present

## 2018-01-12 DIAGNOSIS — H25812 Combined forms of age-related cataract, left eye: Secondary | ICD-10-CM | POA: Diagnosis not present

## 2018-01-12 DIAGNOSIS — H2512 Age-related nuclear cataract, left eye: Secondary | ICD-10-CM | POA: Diagnosis not present

## 2018-02-22 DIAGNOSIS — Z1231 Encounter for screening mammogram for malignant neoplasm of breast: Secondary | ICD-10-CM | POA: Diagnosis not present

## 2018-04-21 DIAGNOSIS — F329 Major depressive disorder, single episode, unspecified: Secondary | ICD-10-CM | POA: Diagnosis not present

## 2018-04-21 DIAGNOSIS — J449 Chronic obstructive pulmonary disease, unspecified: Secondary | ICD-10-CM | POA: Diagnosis not present

## 2018-05-19 DIAGNOSIS — J449 Chronic obstructive pulmonary disease, unspecified: Secondary | ICD-10-CM | POA: Diagnosis not present

## 2018-05-19 DIAGNOSIS — F329 Major depressive disorder, single episode, unspecified: Secondary | ICD-10-CM | POA: Diagnosis not present

## 2018-06-15 DIAGNOSIS — F329 Major depressive disorder, single episode, unspecified: Secondary | ICD-10-CM | POA: Diagnosis not present

## 2018-06-15 DIAGNOSIS — J449 Chronic obstructive pulmonary disease, unspecified: Secondary | ICD-10-CM | POA: Diagnosis not present

## 2018-07-13 DIAGNOSIS — F329 Major depressive disorder, single episode, unspecified: Secondary | ICD-10-CM | POA: Diagnosis not present

## 2018-07-13 DIAGNOSIS — J449 Chronic obstructive pulmonary disease, unspecified: Secondary | ICD-10-CM | POA: Diagnosis not present

## 2018-08-02 DIAGNOSIS — J449 Chronic obstructive pulmonary disease, unspecified: Secondary | ICD-10-CM | POA: Diagnosis not present

## 2018-08-02 DIAGNOSIS — F329 Major depressive disorder, single episode, unspecified: Secondary | ICD-10-CM | POA: Diagnosis not present

## 2018-10-14 DIAGNOSIS — F329 Major depressive disorder, single episode, unspecified: Secondary | ICD-10-CM | POA: Diagnosis not present

## 2018-10-14 DIAGNOSIS — J449 Chronic obstructive pulmonary disease, unspecified: Secondary | ICD-10-CM | POA: Diagnosis not present

## 2018-10-25 DIAGNOSIS — Z299 Encounter for prophylactic measures, unspecified: Secondary | ICD-10-CM | POA: Diagnosis not present

## 2018-10-25 DIAGNOSIS — R0789 Other chest pain: Secondary | ICD-10-CM | POA: Diagnosis not present

## 2018-10-25 DIAGNOSIS — R079 Chest pain, unspecified: Secondary | ICD-10-CM | POA: Diagnosis not present

## 2018-10-25 DIAGNOSIS — I10 Essential (primary) hypertension: Secondary | ICD-10-CM | POA: Diagnosis not present

## 2018-10-25 DIAGNOSIS — R0602 Shortness of breath: Secondary | ICD-10-CM | POA: Diagnosis not present

## 2018-10-25 DIAGNOSIS — Z6827 Body mass index (BMI) 27.0-27.9, adult: Secondary | ICD-10-CM | POA: Diagnosis not present

## 2018-10-25 DIAGNOSIS — R0989 Other specified symptoms and signs involving the circulatory and respiratory systems: Secondary | ICD-10-CM | POA: Diagnosis not present

## 2018-10-25 DIAGNOSIS — F17210 Nicotine dependence, cigarettes, uncomplicated: Secondary | ICD-10-CM | POA: Diagnosis not present

## 2018-10-25 DIAGNOSIS — R05 Cough: Secondary | ICD-10-CM | POA: Diagnosis not present

## 2018-10-25 DIAGNOSIS — J449 Chronic obstructive pulmonary disease, unspecified: Secondary | ICD-10-CM | POA: Diagnosis not present

## 2018-11-24 DIAGNOSIS — F329 Major depressive disorder, single episode, unspecified: Secondary | ICD-10-CM | POA: Diagnosis not present

## 2018-11-24 DIAGNOSIS — J449 Chronic obstructive pulmonary disease, unspecified: Secondary | ICD-10-CM | POA: Diagnosis not present

## 2018-12-03 DIAGNOSIS — F329 Major depressive disorder, single episode, unspecified: Secondary | ICD-10-CM | POA: Diagnosis not present

## 2018-12-03 DIAGNOSIS — J449 Chronic obstructive pulmonary disease, unspecified: Secondary | ICD-10-CM | POA: Diagnosis not present

## 2019-01-26 DIAGNOSIS — F17210 Nicotine dependence, cigarettes, uncomplicated: Secondary | ICD-10-CM | POA: Diagnosis not present

## 2019-01-26 DIAGNOSIS — Z7189 Other specified counseling: Secondary | ICD-10-CM | POA: Diagnosis not present

## 2019-01-26 DIAGNOSIS — I10 Essential (primary) hypertension: Secondary | ICD-10-CM | POA: Diagnosis not present

## 2019-01-26 DIAGNOSIS — M810 Age-related osteoporosis without current pathological fracture: Secondary | ICD-10-CM | POA: Diagnosis not present

## 2019-01-26 DIAGNOSIS — Z299 Encounter for prophylactic measures, unspecified: Secondary | ICD-10-CM | POA: Diagnosis not present

## 2019-01-26 DIAGNOSIS — E039 Hypothyroidism, unspecified: Secondary | ICD-10-CM | POA: Diagnosis not present

## 2019-01-26 DIAGNOSIS — R5383 Other fatigue: Secondary | ICD-10-CM | POA: Diagnosis not present

## 2019-01-26 DIAGNOSIS — Z6827 Body mass index (BMI) 27.0-27.9, adult: Secondary | ICD-10-CM | POA: Diagnosis not present

## 2019-01-26 DIAGNOSIS — Z1339 Encounter for screening examination for other mental health and behavioral disorders: Secondary | ICD-10-CM | POA: Diagnosis not present

## 2019-01-26 DIAGNOSIS — Z0000 Encounter for general adult medical examination without abnormal findings: Secondary | ICD-10-CM | POA: Diagnosis not present

## 2019-01-26 DIAGNOSIS — Z1211 Encounter for screening for malignant neoplasm of colon: Secondary | ICD-10-CM | POA: Diagnosis not present

## 2019-01-26 DIAGNOSIS — Z79899 Other long term (current) drug therapy: Secondary | ICD-10-CM | POA: Diagnosis not present

## 2019-01-26 DIAGNOSIS — Z1331 Encounter for screening for depression: Secondary | ICD-10-CM | POA: Diagnosis not present

## 2019-02-02 DIAGNOSIS — M810 Age-related osteoporosis without current pathological fracture: Secondary | ICD-10-CM | POA: Diagnosis not present

## 2019-02-03 DIAGNOSIS — M810 Age-related osteoporosis without current pathological fracture: Secondary | ICD-10-CM | POA: Diagnosis not present

## 2019-03-03 DIAGNOSIS — Z961 Presence of intraocular lens: Secondary | ICD-10-CM | POA: Diagnosis not present

## 2019-04-05 DIAGNOSIS — J449 Chronic obstructive pulmonary disease, unspecified: Secondary | ICD-10-CM | POA: Diagnosis not present

## 2019-05-05 DIAGNOSIS — H40013 Open angle with borderline findings, low risk, bilateral: Secondary | ICD-10-CM | POA: Diagnosis not present

## 2019-05-05 DIAGNOSIS — H35412 Lattice degeneration of retina, left eye: Secondary | ICD-10-CM | POA: Diagnosis not present

## 2019-05-05 DIAGNOSIS — H26491 Other secondary cataract, right eye: Secondary | ICD-10-CM | POA: Diagnosis not present

## 2019-05-05 DIAGNOSIS — H26493 Other secondary cataract, bilateral: Secondary | ICD-10-CM | POA: Diagnosis not present

## 2019-05-05 DIAGNOSIS — H04223 Epiphora due to insufficient drainage, bilateral lacrimal glands: Secondary | ICD-10-CM | POA: Diagnosis not present

## 2019-05-05 DIAGNOSIS — H35361 Drusen (degenerative) of macula, right eye: Secondary | ICD-10-CM | POA: Diagnosis not present

## 2019-05-05 DIAGNOSIS — J449 Chronic obstructive pulmonary disease, unspecified: Secondary | ICD-10-CM | POA: Diagnosis not present

## 2019-05-10 DIAGNOSIS — M859 Disorder of bone density and structure, unspecified: Secondary | ICD-10-CM | POA: Diagnosis not present

## 2019-06-20 DIAGNOSIS — J449 Chronic obstructive pulmonary disease, unspecified: Secondary | ICD-10-CM | POA: Diagnosis not present

## 2019-07-18 DIAGNOSIS — J449 Chronic obstructive pulmonary disease, unspecified: Secondary | ICD-10-CM | POA: Diagnosis not present

## 2019-08-28 DIAGNOSIS — J449 Chronic obstructive pulmonary disease, unspecified: Secondary | ICD-10-CM | POA: Diagnosis not present

## 2019-09-15 DIAGNOSIS — Z299 Encounter for prophylactic measures, unspecified: Secondary | ICD-10-CM | POA: Diagnosis not present

## 2019-09-15 DIAGNOSIS — I10 Essential (primary) hypertension: Secondary | ICD-10-CM | POA: Diagnosis not present

## 2019-09-15 DIAGNOSIS — R5383 Other fatigue: Secondary | ICD-10-CM | POA: Diagnosis not present

## 2019-09-15 DIAGNOSIS — E039 Hypothyroidism, unspecified: Secondary | ICD-10-CM | POA: Diagnosis not present

## 2019-09-27 DIAGNOSIS — M810 Age-related osteoporosis without current pathological fracture: Secondary | ICD-10-CM | POA: Insufficient documentation

## 2019-09-28 DIAGNOSIS — J449 Chronic obstructive pulmonary disease, unspecified: Secondary | ICD-10-CM | POA: Diagnosis not present

## 2019-10-04 DIAGNOSIS — M810 Age-related osteoporosis without current pathological fracture: Secondary | ICD-10-CM | POA: Diagnosis not present

## 2019-10-28 DIAGNOSIS — J449 Chronic obstructive pulmonary disease, unspecified: Secondary | ICD-10-CM | POA: Diagnosis not present

## 2019-11-08 DIAGNOSIS — Z713 Dietary counseling and surveillance: Secondary | ICD-10-CM | POA: Diagnosis not present

## 2019-11-08 DIAGNOSIS — Z299 Encounter for prophylactic measures, unspecified: Secondary | ICD-10-CM | POA: Diagnosis not present

## 2019-11-08 DIAGNOSIS — J441 Chronic obstructive pulmonary disease with (acute) exacerbation: Secondary | ICD-10-CM | POA: Diagnosis not present

## 2019-11-08 DIAGNOSIS — J069 Acute upper respiratory infection, unspecified: Secondary | ICD-10-CM | POA: Diagnosis not present

## 2019-11-08 DIAGNOSIS — F17210 Nicotine dependence, cigarettes, uncomplicated: Secondary | ICD-10-CM | POA: Diagnosis not present

## 2019-11-17 DIAGNOSIS — J449 Chronic obstructive pulmonary disease, unspecified: Secondary | ICD-10-CM | POA: Diagnosis not present

## 2019-12-29 DIAGNOSIS — J449 Chronic obstructive pulmonary disease, unspecified: Secondary | ICD-10-CM | POA: Diagnosis not present

## 2020-01-27 DIAGNOSIS — J449 Chronic obstructive pulmonary disease, unspecified: Secondary | ICD-10-CM | POA: Diagnosis not present

## 2020-02-07 DIAGNOSIS — I10 Essential (primary) hypertension: Secondary | ICD-10-CM | POA: Diagnosis not present

## 2020-02-07 DIAGNOSIS — Z0000 Encounter for general adult medical examination without abnormal findings: Secondary | ICD-10-CM | POA: Diagnosis not present

## 2020-02-07 DIAGNOSIS — Z7189 Other specified counseling: Secondary | ICD-10-CM | POA: Diagnosis not present

## 2020-02-07 DIAGNOSIS — M810 Age-related osteoporosis without current pathological fracture: Secondary | ICD-10-CM | POA: Diagnosis not present

## 2020-02-07 DIAGNOSIS — Z299 Encounter for prophylactic measures, unspecified: Secondary | ICD-10-CM | POA: Diagnosis not present

## 2020-02-07 DIAGNOSIS — R5383 Other fatigue: Secondary | ICD-10-CM | POA: Diagnosis not present

## 2020-02-07 DIAGNOSIS — F17210 Nicotine dependence, cigarettes, uncomplicated: Secondary | ICD-10-CM | POA: Diagnosis not present

## 2020-02-07 DIAGNOSIS — E039 Hypothyroidism, unspecified: Secondary | ICD-10-CM | POA: Diagnosis not present

## 2020-02-07 DIAGNOSIS — E7800 Pure hypercholesterolemia, unspecified: Secondary | ICD-10-CM | POA: Diagnosis not present

## 2020-02-07 DIAGNOSIS — Z23 Encounter for immunization: Secondary | ICD-10-CM | POA: Diagnosis not present

## 2020-02-07 DIAGNOSIS — Z79899 Other long term (current) drug therapy: Secondary | ICD-10-CM | POA: Diagnosis not present

## 2020-02-28 DIAGNOSIS — J449 Chronic obstructive pulmonary disease, unspecified: Secondary | ICD-10-CM | POA: Diagnosis not present

## 2020-03-29 DIAGNOSIS — J449 Chronic obstructive pulmonary disease, unspecified: Secondary | ICD-10-CM | POA: Diagnosis not present

## 2020-08-13 DIAGNOSIS — Z1231 Encounter for screening mammogram for malignant neoplasm of breast: Secondary | ICD-10-CM | POA: Diagnosis not present

## 2020-08-16 DIAGNOSIS — F17210 Nicotine dependence, cigarettes, uncomplicated: Secondary | ICD-10-CM | POA: Diagnosis not present

## 2020-08-16 DIAGNOSIS — I10 Essential (primary) hypertension: Secondary | ICD-10-CM | POA: Diagnosis not present

## 2020-08-16 DIAGNOSIS — E039 Hypothyroidism, unspecified: Secondary | ICD-10-CM | POA: Diagnosis not present

## 2020-08-16 DIAGNOSIS — J449 Chronic obstructive pulmonary disease, unspecified: Secondary | ICD-10-CM | POA: Diagnosis not present

## 2020-08-16 DIAGNOSIS — Z299 Encounter for prophylactic measures, unspecified: Secondary | ICD-10-CM | POA: Diagnosis not present

## 2020-09-10 DIAGNOSIS — F17210 Nicotine dependence, cigarettes, uncomplicated: Secondary | ICD-10-CM | POA: Diagnosis not present

## 2020-09-10 DIAGNOSIS — N390 Urinary tract infection, site not specified: Secondary | ICD-10-CM | POA: Diagnosis not present

## 2020-09-10 DIAGNOSIS — Z299 Encounter for prophylactic measures, unspecified: Secondary | ICD-10-CM | POA: Diagnosis not present

## 2020-09-10 DIAGNOSIS — D692 Other nonthrombocytopenic purpura: Secondary | ICD-10-CM | POA: Diagnosis not present

## 2020-09-10 DIAGNOSIS — I10 Essential (primary) hypertension: Secondary | ICD-10-CM | POA: Diagnosis not present

## 2020-09-10 DIAGNOSIS — I700 Atherosclerosis of aorta: Secondary | ICD-10-CM | POA: Diagnosis not present

## 2020-10-08 DIAGNOSIS — Z299 Encounter for prophylactic measures, unspecified: Secondary | ICD-10-CM | POA: Diagnosis not present

## 2020-10-08 DIAGNOSIS — I10 Essential (primary) hypertension: Secondary | ICD-10-CM | POA: Diagnosis not present

## 2020-10-08 DIAGNOSIS — D485 Neoplasm of uncertain behavior of skin: Secondary | ICD-10-CM | POA: Diagnosis not present

## 2020-10-08 DIAGNOSIS — D0462 Carcinoma in situ of skin of left upper limb, including shoulder: Secondary | ICD-10-CM | POA: Diagnosis not present

## 2020-11-16 DIAGNOSIS — C44629 Squamous cell carcinoma of skin of left upper limb, including shoulder: Secondary | ICD-10-CM | POA: Diagnosis not present

## 2020-11-16 DIAGNOSIS — Z299 Encounter for prophylactic measures, unspecified: Secondary | ICD-10-CM | POA: Diagnosis not present

## 2020-11-16 DIAGNOSIS — I10 Essential (primary) hypertension: Secondary | ICD-10-CM | POA: Diagnosis not present

## 2020-11-16 DIAGNOSIS — N390 Urinary tract infection, site not specified: Secondary | ICD-10-CM | POA: Diagnosis not present

## 2021-01-07 DIAGNOSIS — H524 Presbyopia: Secondary | ICD-10-CM | POA: Diagnosis not present

## 2021-05-30 DIAGNOSIS — Z79899 Other long term (current) drug therapy: Secondary | ICD-10-CM | POA: Diagnosis not present

## 2021-05-30 DIAGNOSIS — I10 Essential (primary) hypertension: Secondary | ICD-10-CM | POA: Diagnosis not present

## 2021-05-30 DIAGNOSIS — E559 Vitamin D deficiency, unspecified: Secondary | ICD-10-CM | POA: Diagnosis not present

## 2021-05-30 DIAGNOSIS — Z299 Encounter for prophylactic measures, unspecified: Secondary | ICD-10-CM | POA: Diagnosis not present

## 2021-05-30 DIAGNOSIS — F17210 Nicotine dependence, cigarettes, uncomplicated: Secondary | ICD-10-CM | POA: Diagnosis not present

## 2021-05-30 DIAGNOSIS — Z0000 Encounter for general adult medical examination without abnormal findings: Secondary | ICD-10-CM | POA: Diagnosis not present

## 2021-05-30 DIAGNOSIS — R5383 Other fatigue: Secondary | ICD-10-CM | POA: Diagnosis not present

## 2021-05-30 DIAGNOSIS — E7800 Pure hypercholesterolemia, unspecified: Secondary | ICD-10-CM | POA: Diagnosis not present

## 2021-05-30 DIAGNOSIS — Z7189 Other specified counseling: Secondary | ICD-10-CM | POA: Diagnosis not present

## 2021-06-21 DIAGNOSIS — Z7952 Long term (current) use of systemic steroids: Secondary | ICD-10-CM | POA: Diagnosis not present

## 2021-06-21 DIAGNOSIS — M859 Disorder of bone density and structure, unspecified: Secondary | ICD-10-CM | POA: Diagnosis not present

## 2021-06-21 DIAGNOSIS — Z79899 Other long term (current) drug therapy: Secondary | ICD-10-CM | POA: Diagnosis not present

## 2021-11-28 DIAGNOSIS — M810 Age-related osteoporosis without current pathological fracture: Secondary | ICD-10-CM | POA: Diagnosis not present

## 2021-11-28 DIAGNOSIS — E039 Hypothyroidism, unspecified: Secondary | ICD-10-CM | POA: Diagnosis not present

## 2021-12-04 DIAGNOSIS — I10 Essential (primary) hypertension: Secondary | ICD-10-CM | POA: Diagnosis not present

## 2021-12-04 DIAGNOSIS — F17210 Nicotine dependence, cigarettes, uncomplicated: Secondary | ICD-10-CM | POA: Diagnosis not present

## 2021-12-04 DIAGNOSIS — Z299 Encounter for prophylactic measures, unspecified: Secondary | ICD-10-CM | POA: Diagnosis not present

## 2021-12-04 DIAGNOSIS — E039 Hypothyroidism, unspecified: Secondary | ICD-10-CM | POA: Diagnosis not present

## 2021-12-04 DIAGNOSIS — I700 Atherosclerosis of aorta: Secondary | ICD-10-CM | POA: Diagnosis not present

## 2021-12-04 DIAGNOSIS — Z23 Encounter for immunization: Secondary | ICD-10-CM | POA: Diagnosis not present

## 2021-12-17 DIAGNOSIS — F17210 Nicotine dependence, cigarettes, uncomplicated: Secondary | ICD-10-CM | POA: Diagnosis not present

## 2021-12-17 DIAGNOSIS — N390 Urinary tract infection, site not specified: Secondary | ICD-10-CM | POA: Diagnosis not present

## 2021-12-17 DIAGNOSIS — Z299 Encounter for prophylactic measures, unspecified: Secondary | ICD-10-CM | POA: Diagnosis not present

## 2021-12-17 DIAGNOSIS — R350 Frequency of micturition: Secondary | ICD-10-CM | POA: Diagnosis not present

## 2022-02-07 DIAGNOSIS — H524 Presbyopia: Secondary | ICD-10-CM | POA: Diagnosis not present

## 2022-04-07 DIAGNOSIS — H26492 Other secondary cataract, left eye: Secondary | ICD-10-CM | POA: Diagnosis not present

## 2022-04-07 DIAGNOSIS — H524 Presbyopia: Secondary | ICD-10-CM | POA: Diagnosis not present

## 2022-04-07 DIAGNOSIS — H04223 Epiphora due to insufficient drainage, bilateral lacrimal glands: Secondary | ICD-10-CM | POA: Diagnosis not present

## 2022-04-07 DIAGNOSIS — H35431 Paving stone degeneration of retina, right eye: Secondary | ICD-10-CM | POA: Diagnosis not present

## 2022-04-07 DIAGNOSIS — H40013 Open angle with borderline findings, low risk, bilateral: Secondary | ICD-10-CM | POA: Diagnosis not present

## 2022-06-03 DIAGNOSIS — R42 Dizziness and giddiness: Secondary | ICD-10-CM | POA: Diagnosis not present

## 2022-06-03 DIAGNOSIS — I700 Atherosclerosis of aorta: Secondary | ICD-10-CM | POA: Diagnosis not present

## 2022-06-03 DIAGNOSIS — I10 Essential (primary) hypertension: Secondary | ICD-10-CM | POA: Diagnosis not present

## 2022-06-03 DIAGNOSIS — F17210 Nicotine dependence, cigarettes, uncomplicated: Secondary | ICD-10-CM | POA: Diagnosis not present

## 2022-06-03 DIAGNOSIS — Z0000 Encounter for general adult medical examination without abnormal findings: Secondary | ICD-10-CM | POA: Diagnosis not present

## 2022-06-03 DIAGNOSIS — Z299 Encounter for prophylactic measures, unspecified: Secondary | ICD-10-CM | POA: Diagnosis not present

## 2022-06-03 DIAGNOSIS — Z7189 Other specified counseling: Secondary | ICD-10-CM | POA: Diagnosis not present

## 2022-06-03 DIAGNOSIS — E039 Hypothyroidism, unspecified: Secondary | ICD-10-CM | POA: Diagnosis not present

## 2022-06-04 DIAGNOSIS — Z79899 Other long term (current) drug therapy: Secondary | ICD-10-CM | POA: Diagnosis not present

## 2022-06-04 DIAGNOSIS — E559 Vitamin D deficiency, unspecified: Secondary | ICD-10-CM | POA: Diagnosis not present

## 2022-06-04 DIAGNOSIS — E039 Hypothyroidism, unspecified: Secondary | ICD-10-CM | POA: Diagnosis not present

## 2022-06-04 DIAGNOSIS — E7800 Pure hypercholesterolemia, unspecified: Secondary | ICD-10-CM | POA: Diagnosis not present

## 2022-06-04 DIAGNOSIS — R5383 Other fatigue: Secondary | ICD-10-CM | POA: Diagnosis not present

## 2022-07-08 DIAGNOSIS — I10 Essential (primary) hypertension: Secondary | ICD-10-CM | POA: Diagnosis not present

## 2022-07-08 DIAGNOSIS — Z299 Encounter for prophylactic measures, unspecified: Secondary | ICD-10-CM | POA: Diagnosis not present

## 2022-07-08 DIAGNOSIS — F17210 Nicotine dependence, cigarettes, uncomplicated: Secondary | ICD-10-CM | POA: Diagnosis not present

## 2022-07-08 DIAGNOSIS — N390 Urinary tract infection, site not specified: Secondary | ICD-10-CM | POA: Diagnosis not present

## 2022-07-08 DIAGNOSIS — M549 Dorsalgia, unspecified: Secondary | ICD-10-CM | POA: Diagnosis not present

## 2022-07-23 DIAGNOSIS — Z1211 Encounter for screening for malignant neoplasm of colon: Secondary | ICD-10-CM | POA: Diagnosis not present

## 2022-07-23 DIAGNOSIS — Z1212 Encounter for screening for malignant neoplasm of rectum: Secondary | ICD-10-CM | POA: Diagnosis not present

## 2022-07-24 DIAGNOSIS — Z299 Encounter for prophylactic measures, unspecified: Secondary | ICD-10-CM | POA: Diagnosis not present

## 2022-07-24 DIAGNOSIS — M549 Dorsalgia, unspecified: Secondary | ICD-10-CM | POA: Diagnosis not present

## 2022-07-24 DIAGNOSIS — R21 Rash and other nonspecific skin eruption: Secondary | ICD-10-CM | POA: Diagnosis not present

## 2022-10-14 DIAGNOSIS — Z299 Encounter for prophylactic measures, unspecified: Secondary | ICD-10-CM | POA: Diagnosis not present

## 2022-10-14 DIAGNOSIS — J441 Chronic obstructive pulmonary disease with (acute) exacerbation: Secondary | ICD-10-CM | POA: Diagnosis not present

## 2022-10-14 DIAGNOSIS — E039 Hypothyroidism, unspecified: Secondary | ICD-10-CM | POA: Diagnosis not present

## 2022-10-14 DIAGNOSIS — R42 Dizziness and giddiness: Secondary | ICD-10-CM | POA: Diagnosis not present

## 2022-11-19 DIAGNOSIS — J069 Acute upper respiratory infection, unspecified: Secondary | ICD-10-CM | POA: Diagnosis not present

## 2022-11-19 DIAGNOSIS — Z299 Encounter for prophylactic measures, unspecified: Secondary | ICD-10-CM | POA: Diagnosis not present

## 2022-11-19 DIAGNOSIS — R0981 Nasal congestion: Secondary | ICD-10-CM | POA: Diagnosis not present

## 2022-11-19 DIAGNOSIS — R509 Fever, unspecified: Secondary | ICD-10-CM | POA: Diagnosis not present

## 2022-11-25 DIAGNOSIS — E039 Hypothyroidism, unspecified: Secondary | ICD-10-CM | POA: Diagnosis not present

## 2023-02-23 DIAGNOSIS — F17210 Nicotine dependence, cigarettes, uncomplicated: Secondary | ICD-10-CM | POA: Diagnosis not present

## 2023-02-23 DIAGNOSIS — M2669 Other specified disorders of temporomandibular joint: Secondary | ICD-10-CM | POA: Diagnosis not present

## 2023-02-23 DIAGNOSIS — Z299 Encounter for prophylactic measures, unspecified: Secondary | ICD-10-CM | POA: Diagnosis not present

## 2023-02-23 DIAGNOSIS — I10 Essential (primary) hypertension: Secondary | ICD-10-CM | POA: Diagnosis not present

## 2023-02-23 DIAGNOSIS — R52 Pain, unspecified: Secondary | ICD-10-CM | POA: Diagnosis not present

## 2023-06-24 DIAGNOSIS — M549 Dorsalgia, unspecified: Secondary | ICD-10-CM | POA: Diagnosis not present

## 2023-06-24 DIAGNOSIS — J441 Chronic obstructive pulmonary disease with (acute) exacerbation: Secondary | ICD-10-CM | POA: Diagnosis not present

## 2023-06-24 DIAGNOSIS — R52 Pain, unspecified: Secondary | ICD-10-CM | POA: Diagnosis not present

## 2023-06-24 DIAGNOSIS — F17210 Nicotine dependence, cigarettes, uncomplicated: Secondary | ICD-10-CM | POA: Diagnosis not present

## 2023-06-24 DIAGNOSIS — I10 Essential (primary) hypertension: Secondary | ICD-10-CM | POA: Diagnosis not present

## 2023-06-24 DIAGNOSIS — R059 Cough, unspecified: Secondary | ICD-10-CM | POA: Diagnosis not present

## 2023-06-24 DIAGNOSIS — Z299 Encounter for prophylactic measures, unspecified: Secondary | ICD-10-CM | POA: Diagnosis not present

## 2023-06-24 DIAGNOSIS — N390 Urinary tract infection, site not specified: Secondary | ICD-10-CM | POA: Diagnosis not present

## 2023-07-02 DIAGNOSIS — R918 Other nonspecific abnormal finding of lung field: Secondary | ICD-10-CM | POA: Diagnosis not present

## 2023-07-02 DIAGNOSIS — R059 Cough, unspecified: Secondary | ICD-10-CM | POA: Diagnosis not present

## 2023-07-02 DIAGNOSIS — R9389 Abnormal findings on diagnostic imaging of other specified body structures: Secondary | ICD-10-CM | POA: Diagnosis not present

## 2023-07-02 DIAGNOSIS — R0602 Shortness of breath: Secondary | ICD-10-CM | POA: Diagnosis not present

## 2023-07-02 DIAGNOSIS — J984 Other disorders of lung: Secondary | ICD-10-CM | POA: Diagnosis not present

## 2023-07-07 DIAGNOSIS — I10 Essential (primary) hypertension: Secondary | ICD-10-CM | POA: Diagnosis not present

## 2023-07-07 DIAGNOSIS — J449 Chronic obstructive pulmonary disease, unspecified: Secondary | ICD-10-CM | POA: Diagnosis not present

## 2023-07-07 DIAGNOSIS — R928 Other abnormal and inconclusive findings on diagnostic imaging of breast: Secondary | ICD-10-CM | POA: Diagnosis not present

## 2023-07-07 DIAGNOSIS — Z299 Encounter for prophylactic measures, unspecified: Secondary | ICD-10-CM | POA: Diagnosis not present

## 2023-07-07 DIAGNOSIS — I700 Atherosclerosis of aorta: Secondary | ICD-10-CM | POA: Diagnosis not present

## 2023-07-07 DIAGNOSIS — R918 Other nonspecific abnormal finding of lung field: Secondary | ICD-10-CM | POA: Diagnosis not present

## 2023-07-14 ENCOUNTER — Encounter: Payer: Self-pay | Admitting: *Deleted

## 2023-07-20 DIAGNOSIS — I270 Primary pulmonary hypertension: Secondary | ICD-10-CM | POA: Diagnosis not present

## 2023-07-29 DIAGNOSIS — I10 Essential (primary) hypertension: Secondary | ICD-10-CM | POA: Diagnosis not present

## 2023-07-29 DIAGNOSIS — I700 Atherosclerosis of aorta: Secondary | ICD-10-CM | POA: Diagnosis not present

## 2023-07-29 DIAGNOSIS — Z299 Encounter for prophylactic measures, unspecified: Secondary | ICD-10-CM | POA: Diagnosis not present

## 2023-07-29 DIAGNOSIS — J449 Chronic obstructive pulmonary disease, unspecified: Secondary | ICD-10-CM | POA: Diagnosis not present

## 2023-08-16 NOTE — Progress Notes (Signed)
 Victoria Gaines, female    DOB: 1945/12/19    MRN: [id number]   Brief patient profile:  47  yowf  active smoker onset allergic rhinitis fall = spring  referred to pulmonary clinic in [place]  08/21/2023 by Lindell Revel  for sob since 2015 improved on Anoro  and new finding of MPNs on cxr       History of Present Illness  08/21/2023  Pulmonary/ 1st office eval/ [facility] / [place] [facility]  Chief Complaint  Patient presents with   Establish Care  Dyspnea:  not limited x avoided hills /steps, nl pace on anoro one year and no change with onset of cp x one year LUQ comes and goes better supine  Cough: minimal in am  Sleep: bed is flat/ 1 pillow  SABA use: maybe once a week  02: none  LDSCT:ct chest  w contrast   No obvious day to day or daytime pattern/variability or assoc excess/ purulent sputum or mucus plugs or hemoptysis or cp or chest tightness, subjective wheeze or overt sinus or hb symptoms.    Also denies any obvious fluctuation of symptoms with weather or environmental changes or other aggravating or alleviating factors except as outlined above   No unusual exposure hx or h/o childhood pna/ asthma or knowledge of premature birth.  Current Allergies, Complete Past Medical History, Past Surgical History, Family History, and Social History were reviewed in [employer] record.  ROS  The following are not active complaints unless bolded Hoarseness, sore throat, dysphagia, dental problems, itching, sneezing,  nasal congestion or discharge of excess mucus or purulent secretions, ear ache,   fever, chills, sweats, unintended wt loss or wt gain, classically pleuritic or exertional cp,  orthopnea pnd or arm/hand swelling  or leg swelling, presyncope, palpitations, abdominal pain, anorexia, nausea, vomiting, diarrhea  or change in bowel habits or change in bladder habits, change in stools or change in urine, dysuria, hematuria,  rash, arthralgias, visual complaints,  headache, numbness, weakness or ataxia or problems with walking or coordination,  change in mood or  memory.            Outpatient Medications Prior to Visit  Medication Sig Dispense Refill   albuterol  (VENTOLIN  HFA) 108 (90 Base) MCG/ACT inhaler Inhale 2 puffs into the lungs every 6 (six) hours as needed for wheezing or shortness of breath.     ANORO ELLIPTA 62.5-25 MCG/ACT AEPB Inhale 1 puff into the lungs daily.     levothyroxine  (SYNTHROID ) 112 MCG tablet Take 112 mcg by mouth daily.     rosuvastatin (CRESTOR) 5 MG tablet Take 5 mg by mouth daily.     venlafaxine  XR (EFFEXOR -XR) 150 MG 24 hr capsule Take 150 mg by mouth daily with breakfast.     ALPRAZolam  (XANAX ) 0.5 MG tablet Take 0.5 mg by mouth daily as needed for anxiety.     aspirin  EC 325 MG tablet Take 1 tablet (325 mg total) by mouth 2 (two) times daily. 84 tablet 0   FLUoxetine  (PROZAC ) 20 MG capsule Take 20 mg by mouth daily.     levothyroxine  (SYNTHROID , LEVOTHROID) 88 MCG tablet Take 88 mcg by mouth daily before breakfast.     oxyCODONE  (OXY IR/ROXICODONE ) 5 MG immediate release tablet Take 1-3 tablets (5-15 mg total) by mouth every 4 (four) hours as needed. 90 tablet 0   polyethylene glycol (MIRALAX  / GLYCOLAX ) packet Take 17 g by mouth daily as needed for mild constipation. 14 each 0  No facility-administered medications prior to visit.    Past Medical History:  Diagnosis Date   Depression    Hypothyroid       Objective:     BP 121/77   Pulse 87   Ht 5\' 6"  (1.676 m)   Wt 159 lb (72.1 kg)   SpO2 94%   BMI 25.66 kg/m   SpO2: 94 % RA   Very pleasant amb wf nad    HEENT : Oropharynx  clear/ edentulous   Nasal turbinates nl    NECK :  without  apparent JVD/ palpable Nodes/TM    LUNGS: no acc muscle use,  Min barrel  contour chest wall with bilateral  slightly decreased bs s audible wheeze and  without cough on insp or exp maneuvers and min  Hyperresonant  to  percussion bilaterally    CV:  RRR  no  s3 or murmur or increase in P2, and no edema   ABD:  soft and nontender with pos end  insp Hoover's  in the supine position.  No bruits or organomegaly appreciated   MS:  Nl gait/ ext warm without deformities Or obvious joint restrictions  calf tenderness, cyanosis or clubbing     SKIN: warm and dry without lesions    NEURO:  alert, approp, nl sensorium with  no motor or cerebellar deficits apparent.            Assessment   Chest pain, atypical Onset 2024  - CT chest [facility]: 07/02/23 neg for L chest or skeletal findings  - rx for IBS 08/21/2023 >>>   F/u 3 months   COPD GOLD ? / still smoking Onset ? Around 2015  - 08/21/2023  After extensive coaching inhaler device,  effectiveness =    90% DPI/25% hfa  - Allergy screen 08/21/2023 >  Eos 0.   alpha one AT phenotype  - 08/21/2023   Walked on RA   x  3  lap(s) =  approx 450  ft  @ mod pace, stopped due to end of study  with lowest 02 sats 92% s sob    Pt is Group B in terms of symptom/risk and laba/lama therefore appropriate rx at this point >>>  continue anoro and approp saba:  Re SABA :  I spent extra time with pt today reviewing appropriate use of albuterol  for prn use on exertion with the following points: 1) saba is for relief of sob that does not improve by walking a slower pace or resting but rather if the pt does not improve after trying this first. 2) If the pt is convinced, as many are, that saba helps recover from activity faster then it's easy to tell if this is the case by re-challenging : ie stop, take the inhaler, then p 5 minutes try the exact same activity (intensity of workload) that just caused the symptoms and see if they are substantially diminished or not after saba 3) if there is an activity that reproducibly causes the symptoms, try the saba 15 min before the activity on alternate days   If in fact the saba really does help, then fine to continue to use it prn but advised may need to look closer at the  maintenance regimen being used to achieve better control of airways disease with exertion.      Multiple pulmonary nodules determined by computed tomography of lung See CT chest [facility]: 07/02/23 - Quant TB 08/21/2023 >>>  - ESR 08/21/2023   CT results  reviewed with pt >>> Too small for PET or bx, not suspicious enough for excisional bx > really only option for now is follow the Fleischner society guidelines as rec by radiology.   No baseline to compare, no known primary but she is a smoking>>>   just needs repeat at 3 m = 10/01/23 and check above labs / should be done at [facility] for quick comparison  Cigarette smoker 4-5 min discussion re active cigarette smoking in addition to office E&M  Ask about tobacco use:   ongoing  Advise quitting   I took an extended  opportunity with this patient to outline the consequences of continued cigarette use  in airway disorders based on all the data we have from the multiple national lung health studies (perfomed over decades at millions of dollars in cost)  indicating that smoking cessation, not choice of inhalers or pulmonary physicians, is the most important aspect of her care - informed based on initial eval it is certainly not too late to quit.   Assess willingness:  Not fully committed at this point Assist in quit attempt:  Per PCP when ready Arrange follow up:   Follow up per Primary Care planned           Vernestine Gondola, MD 08/21/2023

## 2023-08-21 ENCOUNTER — Encounter: Payer: Self-pay | Admitting: Internal Medicine

## 2023-08-21 ENCOUNTER — Ambulatory Visit: Admitting: Internal Medicine

## 2023-08-21 VITALS — BP 121/77 | HR 87 | Ht 66.0 in | Wt 159.0 lb

## 2023-08-21 DIAGNOSIS — F17210 Nicotine dependence, cigarettes, uncomplicated: Secondary | ICD-10-CM | POA: Diagnosis not present

## 2023-08-21 DIAGNOSIS — J449 Chronic obstructive pulmonary disease, unspecified: Secondary | ICD-10-CM | POA: Diagnosis not present

## 2023-08-21 DIAGNOSIS — R918 Other nonspecific abnormal finding of lung field: Secondary | ICD-10-CM

## 2023-08-21 DIAGNOSIS — R0789 Other chest pain: Secondary | ICD-10-CM

## 2023-08-21 NOTE — Assessment & Plan Note (Signed)
 4-5 min discussion re active cigarette smoking in addition to office E&M  Ask about tobacco use:   ongoing  Advise quitting   I took an extended  opportunity with this patient to outline the consequences of continued cigarette use  in airway disorders based on all the data we have from the multiple national lung health studies (perfomed over decades at millions of dollars in cost)  indicating that smoking cessation, not choice of inhalers or pulmonary physicians, is the most important aspect of her care - informed based on initial eval it is certainly not too late to quit.   Assess willingness:  Not fully committed at this point Assist in quit attempt:  Per PCP when ready Arrange follow up:   Follow up per Primary Care planned

## 2023-08-21 NOTE — Assessment & Plan Note (Addendum)
 See CT chest [facility]: 07/02/23 - Quant TB 08/21/2023 >>>  - ESR 08/21/2023   CT results reviewed with pt >>> Too small for PET or bx, not suspicious enough for excisional bx > really only option for now is follow the Fleischner society guidelines as rec by radiology.   No baseline to compare, no known primary but she is a smoking>>>   just needs repeat at 3 m = 10/01/23 and check above labs / should be done at [facility] for quick comparison  Each maintenance medication was reviewed in detail including emphasizing most importantly the difference between maintenance and prns and under what circumstances the prns are to be triggered using an action plan format where appropriate.  Total time for H and P, chart review, counseling, reviewing hfa/dpi device(s) , directly observing portions of ambulatory 02 saturation study/ and generating customized AVS unique to this office visit / same day charting = 45 min new pt eval

## 2023-08-21 NOTE — Assessment & Plan Note (Addendum)
 Onset ? Around 2015  - 08/21/2023  After extensive coaching inhaler device,  effectiveness =    90% DPI/25% hfa  - Allergy screen 08/21/2023 >  Eos 0.   alpha one AT phenotype  - 08/21/2023   Walked on RA   x  3  lap(s) =  approx 450  ft  @ mod pace, stopped due to end of study  with lowest 02 sats 92% s sob    Pt is Group B in terms of symptom/risk and laba/lama therefore appropriate rx at this point >>>  continue anoro and approp saba:  Re SABA :  I spent extra time with pt today reviewing appropriate use of albuterol  for prn use on exertion with the following points: 1) saba is for relief of sob that does not improve by walking a slower pace or resting but rather if the pt does not improve after trying this first. 2) If the pt is convinced, as many are, that saba helps recover from activity faster then it's easy to tell if this is the case by re-challenging : ie stop, take the inhaler, then p 5 minutes try the exact same activity (intensity of workload) that just caused the symptoms and see if they are substantially diminished or not after saba 3) if there is an activity that reproducibly causes the symptoms, try the saba 15 min before the activity on alternate days   If in fact the saba really does help, then fine to continue to use it prn but advised may need to look closer at the maintenance regimen being used to achieve better control of airways disease with exertion.    F/u 3 m with pfts

## 2023-08-21 NOTE — Patient Instructions (Addendum)
 Plan A = Automatic = Always=    Anoro one click each am - remember to tug to get it going   Plan B = Backup (to supplement plan A, not to replace it) Only use your albuterol  inhaler as a rescue medication to be used if you can't catch your breath by resting or doing a relaxed purse lip breathing pattern.  - The less you use it, the better it will work when you need it. - Ok to use the inhaler up to 2 puffs  every 4 hours if you must but call for appointment if use goes up over your usual need - Don't leave home without it !!  (think of it like the spare tire for your car)   Work on inhaler technique:  relax and gently blow all the way out then take a nice smooth full deep breath back in, triggering the inhaler at same time you start breathing in.  Hold breath in for at least  5 seconds if you can.     Classic subdiaphragmatic pain pattern suggests ibs:  Stereotypical, migratory with a very limited distribution of pain locations, daytime, not usually exacerbated by exercise  or coughing, worse in sitting position, frequently associated with generalized abd bloating, not as likely to be present supine due to the dome effect of the diaphragm which  is  canceled in that position. Frequently these patients have had multiple negative GI workups and CT scans.  Treatment consists of avoiding foods that cause gas (especially boiled eggs, mexcican food but especially  beans and undercooked vegetables like  spinach and some salads)  and citrucel 1 heaping tbsp twice daily with a large glass of water.  Pain should improve w/in 2 weeks and if not then consider further GI work up.    Please remember to go to the lab department   for your tests - we will call you with the results when they are available.     You will need a follow up CT chest 3 months after your last one in [place] to compare to prior  The key is to stop smoking completely before smoking completely stops you!   Please schedule a follow up visit  in 3 months but call sooner if needed  with  PFTs on return.   Victoria Gaines

## 2023-08-21 NOTE — Assessment & Plan Note (Addendum)
 Onset 2024  - CT chest [facility]: 07/02/23 neg for L chest or skeletal findings  - rx for IBS 08/21/2023 >>>   F/u 3 months

## 2023-08-26 LAB — QUANTIFERON-TB GOLD PLUS
QuantiFERON Mitogen Value: >10 [IU]/mL
QuantiFERON Nil Value: 0.05 [IU]/mL
QuantiFERON TB1 Ag Value: 0.12 [IU]/mL
QuantiFERON TB2 Ag Value: 0.12 [IU]/mL
QuantiFERON-TB Gold Plus: NEGATIVE

## 2023-08-26 LAB — CBC WITH DIFFERENTIAL/PLATELET
Basophils Absolute: 0 10*3/uL (ref 0.0–0.2)
Basos: 0 %
EOS (ABSOLUTE): 0 10*3/uL (ref 0.0–0.4)
Eos: 0 %
Hematocrit: 44.5 % (ref 34.0–46.6)
Hemoglobin: 14.4 g/dL (ref 11.1–15.9)
Immature Grans (Abs): 0 10*3/uL (ref 0.0–0.1)
Immature Granulocytes: 0 %
Lymphocytes Absolute: 1.3 10*3/uL (ref 0.7–3.1)
Lymphs: 13 %
MCH: 32.3 pg (ref 26.6–33.0)
MCHC: 32.4 g/dL (ref 31.5–35.7)
MCV: 100 fL — ABNORMAL HIGH (ref 79–97)
Monocytes Absolute: 0.7 10*3/uL (ref 0.1–0.9)
Monocytes: 7 %
Neutrophils Absolute: 8 10*3/uL — ABNORMAL HIGH (ref 1.4–7.0)
Neutrophils: 80 %
Platelets: 280 10*3/uL (ref 150–450)
RBC: 4.46 x10E6/uL (ref 3.77–5.28)
RDW: 12.2 % (ref 11.7–15.4)
WBC: 10 10*3/uL (ref 3.4–10.8)

## 2023-08-26 LAB — SEDIMENTATION RATE: Sed Rate: 22 mm/h (ref 0–40)

## 2023-08-26 LAB — ALPHA-1-ANTITRYPSIN PHENOTYP: A-1 Antitrypsin: 213 mg/dL — ABNORMAL HIGH (ref 101–187)

## 2023-11-05 DIAGNOSIS — I10 Essential (primary) hypertension: Secondary | ICD-10-CM | POA: Diagnosis not present

## 2023-11-05 DIAGNOSIS — N390 Urinary tract infection, site not specified: Secondary | ICD-10-CM | POA: Diagnosis not present

## 2023-11-05 DIAGNOSIS — R350 Frequency of micturition: Secondary | ICD-10-CM | POA: Diagnosis not present

## 2023-11-05 DIAGNOSIS — Z299 Encounter for prophylactic measures, unspecified: Secondary | ICD-10-CM | POA: Diagnosis not present

## 2023-11-05 DIAGNOSIS — K5900 Constipation, unspecified: Secondary | ICD-10-CM | POA: Diagnosis not present

## 2023-11-09 NOTE — Progress Notes (Deleted)
 Victoria Gaines, female    DOB: [birth year]    MRN: [id number]   Brief patient profile:  62  yowf  active smokerMM  onset allergic rhinitis fall = spring  referred to pulmonary clinic in [place]  08/21/2023 by MARLA Meeter  for sob since 2015 improved on Anoro  and new finding of MPNs on cxr       History of Present Illness  08/21/2023  Pulmonary/ 1st office eval/ Darlean / [place] [facility]  Chief Complaint  Patient presents with   Establish Care  Dyspnea:  not limited x avoided hills /steps, nl pace on anoro one year and no change with onset of cp x one year LUQ comes and goes better supine  Cough: minimal in am  Sleep: bed is flat/ 1 pillow  SABA use: maybe once a week  02: none  LDSCT:ct chest  w contrast  Rec Plan A = Automatic = Always=    Anoro one click each am - remember to tug to get it going  Plan B = Backup (to supplement plan A, not to replace it) Only use your albuterol  inhaler as a rescue medication Work on inhaler technique:  Classic subdiaphragmatic pain pattern suggests ibs:  S Treatment consists of avoiding foods that cause gas (especially boiled eggs, mexcican food but especially  beans and undercooked vegetables like  spinach and some salads)  and citrucel 1 heaping tbsp twice daily with a large glass of water.  You will need a follow up CT chest 3 months after your last one in [place] to compare to prior The key is to stop smoking completely before smoking completely stops you! Please schedule a follow up visit in 3 months but call sooner if needed  with  PFTs on return.  Allergy screen 08/21/2023 >  Eos 0.0   alpha one AT phenotype MM level 213    11/12/2023  f/u ov/[place] office/Isay Perleberg re: MPNs AB IBS maint on *** *** smoker  No chief complaint on file.   Dyspnea:  *** Cough: *** Sleeping: ***   resp cc  SABA use: *** 02: ***  Lung cancer screening: ***   No obvious day to day or daytime variability or assoc excess/ purulent sputum or mucus plugs or  hemoptysis or cp or chest tightness, subjective wheeze or overt sinus or hb symptoms.    Also denies any obvious fluctuation of symptoms with weather or environmental changes or other aggravating or alleviating factors except as outlined above   No unusual exposure hx or h/o childhood pna/ asthma or knowledge of premature birth.  Current Allergies, Complete Past Medical History, Past Surgical History, Family History, and Social History were reviewed in [employer] record.  ROS  The following are not active complaints unless bolded Hoarseness, sore throat, dysphagia, dental problems, itching, sneezing,  nasal congestion or discharge of excess mucus or purulent secretions, ear ache,   fever, chills, sweats, unintended wt loss or wt gain, classically pleuritic or exertional cp,  orthopnea pnd or arm/hand swelling  or leg swelling, presyncope, palpitations, abdominal pain, anorexia, nausea, vomiting, diarrhea  or change in bowel habits or change in bladder habits, change in stools or change in urine, dysuria, hematuria,  rash, arthralgias, visual complaints, headache, numbness, weakness or ataxia or problems with walking or coordination,  change in mood or  memory.        No outpatient medications have been marked as taking for the 11/12/23 encounter (Appointment) with Lorrene Graef B, MD.  Past Medical History:  Diagnosis Date   Depression    Hypothyroid       Objective:     Wt Readings from Last 3 Encounters:  08/21/23 159 lb (72.1 kg)      Vital signs reviewed  11/12/2023  - Note at rest 02 sats  ***% on ***   General appearance:    ***     Min bar***          Assessment

## 2023-11-12 ENCOUNTER — Ambulatory Visit (HOSPITAL_COMMUNITY): Admission: RE | Admit: 2023-11-12 | Source: Ambulatory Visit

## 2023-11-12 ENCOUNTER — Ambulatory Visit: Admitting: Internal Medicine

## 2023-11-12 ENCOUNTER — Telehealth: Payer: Self-pay | Admitting: Internal Medicine

## 2023-11-12 NOTE — Telephone Encounter (Signed)
 LVM for patient to call and discuss rescheduling the PFT at [facility] and follow up with Dr. Darlean on 11/12/23

## 2023-11-13 ENCOUNTER — Encounter: Payer: Self-pay | Admitting: Internal Medicine

## 2023-11-19 DIAGNOSIS — Z7189 Other specified counseling: Secondary | ICD-10-CM | POA: Diagnosis not present

## 2023-11-19 DIAGNOSIS — F17210 Nicotine dependence, cigarettes, uncomplicated: Secondary | ICD-10-CM | POA: Diagnosis not present

## 2023-11-19 DIAGNOSIS — Z1389 Encounter for screening for other disorder: Secondary | ICD-10-CM | POA: Diagnosis not present

## 2023-11-19 DIAGNOSIS — I272 Pulmonary hypertension, unspecified: Secondary | ICD-10-CM | POA: Diagnosis not present

## 2023-11-19 DIAGNOSIS — Z299 Encounter for prophylactic measures, unspecified: Secondary | ICD-10-CM | POA: Diagnosis not present

## 2023-11-19 DIAGNOSIS — J449 Chronic obstructive pulmonary disease, unspecified: Secondary | ICD-10-CM | POA: Diagnosis not present

## 2023-11-19 DIAGNOSIS — Z0000 Encounter for general adult medical examination without abnormal findings: Secondary | ICD-10-CM | POA: Diagnosis not present

## 2023-12-08 DIAGNOSIS — R350 Frequency of micturition: Secondary | ICD-10-CM | POA: Diagnosis not present

## 2023-12-08 DIAGNOSIS — F17210 Nicotine dependence, cigarettes, uncomplicated: Secondary | ICD-10-CM | POA: Diagnosis not present

## 2023-12-08 DIAGNOSIS — Z299 Encounter for prophylactic measures, unspecified: Secondary | ICD-10-CM | POA: Diagnosis not present

## 2023-12-08 DIAGNOSIS — R0989 Other specified symptoms and signs involving the circulatory and respiratory systems: Secondary | ICD-10-CM | POA: Diagnosis not present

## 2023-12-08 DIAGNOSIS — I10 Essential (primary) hypertension: Secondary | ICD-10-CM | POA: Diagnosis not present

## 2023-12-08 DIAGNOSIS — N390 Urinary tract infection, site not specified: Secondary | ICD-10-CM | POA: Diagnosis not present

## 2023-12-08 DIAGNOSIS — R52 Pain, unspecified: Secondary | ICD-10-CM | POA: Diagnosis not present

## 2023-12-08 DIAGNOSIS — R0602 Shortness of breath: Secondary | ICD-10-CM | POA: Diagnosis not present

## 2023-12-08 DIAGNOSIS — M25561 Pain in right knee: Secondary | ICD-10-CM | POA: Diagnosis not present

## 2023-12-16 DIAGNOSIS — I10 Essential (primary) hypertension: Secondary | ICD-10-CM | POA: Diagnosis not present

## 2023-12-16 DIAGNOSIS — Z23 Encounter for immunization: Secondary | ICD-10-CM | POA: Diagnosis not present

## 2023-12-16 DIAGNOSIS — Z299 Encounter for prophylactic measures, unspecified: Secondary | ICD-10-CM | POA: Diagnosis not present

## 2023-12-16 DIAGNOSIS — N390 Urinary tract infection, site not specified: Secondary | ICD-10-CM | POA: Diagnosis not present

## 2023-12-16 DIAGNOSIS — R350 Frequency of micturition: Secondary | ICD-10-CM | POA: Diagnosis not present

## 2024-01-13 DIAGNOSIS — Z0000 Encounter for general adult medical examination without abnormal findings: Secondary | ICD-10-CM | POA: Diagnosis not present

## 2024-01-13 DIAGNOSIS — R5383 Other fatigue: Secondary | ICD-10-CM | POA: Diagnosis not present

## 2024-01-13 DIAGNOSIS — I10 Essential (primary) hypertension: Secondary | ICD-10-CM | POA: Diagnosis not present

## 2024-01-13 DIAGNOSIS — Z299 Encounter for prophylactic measures, unspecified: Secondary | ICD-10-CM | POA: Diagnosis not present

## 2024-01-13 DIAGNOSIS — Z79899 Other long term (current) drug therapy: Secondary | ICD-10-CM | POA: Diagnosis not present

## 2024-01-13 DIAGNOSIS — M810 Age-related osteoporosis without current pathological fracture: Secondary | ICD-10-CM | POA: Diagnosis not present

## 2024-01-13 DIAGNOSIS — E7800 Pure hypercholesterolemia, unspecified: Secondary | ICD-10-CM | POA: Diagnosis not present

## 2024-01-13 DIAGNOSIS — R52 Pain, unspecified: Secondary | ICD-10-CM | POA: Diagnosis not present

## 2024-03-22 ENCOUNTER — Encounter (HOSPITAL_COMMUNITY): Payer: Self-pay

## 2024-03-22 ENCOUNTER — Inpatient Hospital Stay (HOSPITAL_COMMUNITY)
Admission: EM | Admit: 2024-03-22 | Discharge: 2024-03-25 | DRG: 482 | Disposition: A | Discharge status: Home Health | Source: Other Acute Inpatient Hospital | Attending: Internal Medicine | Admitting: Internal Medicine

## 2024-03-22 ENCOUNTER — Encounter (HOSPITAL_COMMUNITY): Payer: Self-pay | Admitting: Student in an Organized Health Care Education/Training Program

## 2024-03-22 ENCOUNTER — Other Ambulatory Visit: Payer: Self-pay

## 2024-03-22 DIAGNOSIS — Z743 Need for continuous supervision: Secondary | ICD-10-CM | POA: Diagnosis not present

## 2024-03-22 DIAGNOSIS — Z23 Encounter for immunization: Secondary | ICD-10-CM

## 2024-03-22 DIAGNOSIS — Z881 Allergy status to other antibiotic agents status: Secondary | ICD-10-CM | POA: Diagnosis not present

## 2024-03-22 DIAGNOSIS — J449 Chronic obstructive pulmonary disease, unspecified: Secondary | ICD-10-CM | POA: Diagnosis present

## 2024-03-22 DIAGNOSIS — M1811 Unilateral primary osteoarthritis of first carpometacarpal joint, right hand: Secondary | ICD-10-CM | POA: Diagnosis present

## 2024-03-22 DIAGNOSIS — M25531 Pain in right wrist: Secondary | ICD-10-CM

## 2024-03-22 DIAGNOSIS — M79604 Pain in right leg: Secondary | ICD-10-CM | POA: Diagnosis not present

## 2024-03-22 DIAGNOSIS — E039 Hypothyroidism, unspecified: Secondary | ICD-10-CM | POA: Diagnosis present

## 2024-03-22 DIAGNOSIS — Z8249 Family history of ischemic heart disease and other diseases of the circulatory system: Secondary | ICD-10-CM | POA: Diagnosis not present

## 2024-03-22 DIAGNOSIS — E785 Hyperlipidemia, unspecified: Secondary | ICD-10-CM | POA: Diagnosis present

## 2024-03-22 DIAGNOSIS — W1830XA Fall on same level, unspecified, initial encounter: Secondary | ICD-10-CM | POA: Diagnosis present

## 2024-03-22 DIAGNOSIS — S72001A Fracture of unspecified part of neck of right femur, initial encounter for closed fracture: Secondary | ICD-10-CM | POA: Diagnosis present

## 2024-03-22 DIAGNOSIS — Z833 Family history of diabetes mellitus: Secondary | ICD-10-CM | POA: Diagnosis not present

## 2024-03-22 DIAGNOSIS — Z79890 Hormone replacement therapy (postmenopausal): Secondary | ICD-10-CM | POA: Diagnosis not present

## 2024-03-22 DIAGNOSIS — F32A Depression, unspecified: Secondary | ICD-10-CM | POA: Diagnosis present

## 2024-03-22 DIAGNOSIS — S7290XA Unspecified fracture of unspecified femur, initial encounter for closed fracture: Secondary | ICD-10-CM | POA: Diagnosis present

## 2024-03-22 DIAGNOSIS — F17210 Nicotine dependence, cigarettes, uncomplicated: Secondary | ICD-10-CM | POA: Diagnosis present

## 2024-03-22 DIAGNOSIS — R0902 Hypoxemia: Secondary | ICD-10-CM | POA: Diagnosis not present

## 2024-03-22 HISTORY — DX: Chronic obstructive pulmonary disease, unspecified: J44.9

## 2024-03-22 MED ORDER — HYDROCODONE-ACETAMINOPHEN 5-325 MG PO TABS
1.0000 | ORAL_TABLET | Freq: Four times a day (QID) | ORAL | Status: DC | PRN
  Administered 2024-03-22 – 2024-03-23 (×2): 1 via ORAL
  Administered 2024-03-23: 2 via ORAL
  Administered 2024-03-24 (×4): 1 via ORAL
  Filled 2024-03-22 (×2): qty 1
  Filled 2024-03-22: qty 2
  Filled 2024-03-22 (×4): qty 1

## 2024-03-22 MED ORDER — TRAZODONE HCL 50 MG PO TABS: 25.0000 mg | ORAL_TABLET | Freq: Every evening | ORAL | Status: DC | PRN

## 2024-03-22 MED ORDER — MORPHINE SULFATE (PF) 2 MG/ML IV SOLN: 0.5000 mg | INTRAVENOUS | Status: DC | PRN

## 2024-03-22 MED ORDER — PNEUMOCOCCAL 20-VAL CONJ VACC 0.5 ML IM SUSY
0.5000 mL | PREFILLED_SYRINGE | INTRAMUSCULAR | Status: AC
  Administered 2024-03-25: 0.5 mL via INTRAMUSCULAR
  Filled 2024-03-22: qty 0.5

## 2024-03-22 MED ORDER — VENLAFAXINE HCL ER 150 MG PO CP24: 150.0000 mg | ORAL_CAPSULE | Freq: Every day | ORAL | Status: DC

## 2024-03-22 MED ORDER — ALBUTEROL SULFATE (2.5 MG/3ML) 0.083% IN NEBU: 2.5000 mg | INHALATION_SOLUTION | Freq: Four times a day (QID) | RESPIRATORY_TRACT | Status: DC | PRN

## 2024-03-22 MED ORDER — ORAL CARE MOUTH RINSE: 15.0000 mL | OROMUCOSAL | Status: DC | PRN

## 2024-03-22 MED ORDER — MAGNESIUM HYDROXIDE 400 MG/5ML PO SUSP: 30.0000 mL | Freq: Every day | ORAL | Status: DC | PRN

## 2024-03-22 MED ORDER — ROSUVASTATIN CALCIUM 5 MG PO TABS
5.0000 mg | ORAL_TABLET | Freq: Every day | ORAL | Status: DC
  Administered 2024-03-23 – 2024-03-25 (×3): 5 mg via ORAL
  Filled 2024-03-22 (×3): qty 1

## 2024-03-22 MED ORDER — LEVOTHYROXINE SODIUM 125 MCG PO TABS
125.0000 ug | ORAL_TABLET | Freq: Every day | ORAL | Status: DC
  Administered 2024-03-23 – 2024-03-25 (×3): 125 ug via ORAL
  Filled 2024-03-22 (×4): qty 1

## 2024-03-22 MED ORDER — ONDANSETRON HCL 4 MG/2ML IJ SOLN: 4.0000 mg | INTRAMUSCULAR | Status: DC | PRN

## 2024-03-22 MED ORDER — VENLAFAXINE HCL ER 75 MG PO CP24
225.0000 mg | ORAL_CAPSULE | Freq: Every day | ORAL | Status: DC
  Administered 2024-03-23 – 2024-03-25 (×3): 225 mg via ORAL
  Filled 2024-03-22 (×3): qty 1

## 2024-03-22 MED ORDER — ACETAMINOPHEN 325 MG PO TABS: 650.0000 mg | ORAL_TABLET | ORAL | Status: DC | PRN

## 2024-03-22 NOTE — H&P (Incomplete)
 "     Victoria Gaines   PATIENT NAME: Victoria Gaines    MR#:  [id number]  DATE OF BIRTH:  04/13/1945  DATE OF ADMISSION:  03/22/2024  PRIMARY CARE PHYSICIAN: Gaines, Victoria C   Patient is coming from: [place] G[facility]  REQUESTING/REFERRING PHYSICIAN: [facility]  CHIEF COMPLAINT:   Chief Complaint  Patient presents with   Leg Injury    HISTORY OF PRESENT ILLNESS:  Victoria Gaines is a 78 y.o. female with medical history significant for dyslipidemia, GERD, COPD, depression and hypothyroidism ED Course: *** EKG as reviewed by me : *** Imaging: *** PAST MEDICAL HISTORY:   Past Medical History:  Diagnosis Date   Depression    Hypothyroid   -Dyslipidemia - COPD -GERD PAST SURGICAL HISTORY:   Past Surgical History:  Procedure Laterality Date   INTRAMEDULLARY (IM) NAIL INTERTROCHANTERIC Left 05/01/2013   Procedure: INTRAMEDULLARY (IM) NAIL INTERTROCHANTRIC;  Surgeon: Victoria Ozell Cummins, MD;  Location: [facility] OR;  Service: Orthopedics;  Laterality: Left;    SOCIAL HISTORY:   Social History   Tobacco Use   Smoking status: Every Day    Current packs/day: 0.50    Types: Cigarettes   Smokeless tobacco: Never  Substance Use Topics   Alcohol  use: Not Currently    FAMILY HISTORY:  Positive for diabetes mellitus and coronary artery disease in her father.  Positive for asthma, atrial fibrillation, COPD and cancer in her mother.  DRUG ALLERGIES:  Allergies[1]  REVIEW OF SYSTEMS:   ROS As per history of present illness. All pertinent systems were reviewed above. Constitutional, HEENT, cardiovascular, respiratory, GI, GU, musculoskeletal, neuro, psychiatric, endocrine, integumentary and hematologic systems were reviewed and are otherwise negative/unremarkable except for positive findings mentioned above in the HPI.   MEDICATIONS AT HOME:   Prior to Admission medications  Medication Sig Start Date End Date Taking? Authorizing Provider  albuterol  (VENTOLIN  HFA) 108 (90  Base) MCG/ACT inhaler Inhale 2 puffs into the lungs every 6 (six) hours as needed for wheezing or shortness of breath. 03/27/23   [provider]  ANORO ELLIPTA  62.5-25 MCG/ACT AEPB Inhale 1 puff into the lungs daily. 05/22/17   [provider]  levothyroxine  (SYNTHROID ) 112 MCG tablet Take 112 mcg by mouth daily. 08/04/23   [provider]  rosuvastatin  (CRESTOR ) 5 MG tablet Take 5 mg by mouth daily. 08/18/23   [provider]  venlafaxine  XR (EFFEXOR -XR) 150 MG 24 hr capsule Take 150 mg by mouth daily with breakfast.    [provider]      VITAL SIGNS:  Blood pressure (!) 130/56, pulse 85, temperature 99.1 F (37.3 C), temperature source Oral, resp. rate 18, height 5' 6 (1.676 Gaines), weight 69.1 kg, SpO2 92%.  PHYSICAL EXAMINATION:  Physical Exam  GENERAL:  78 y.o.-year-old female patient lying in the bed with no acute distress.  EYES: Pupils equal, round, reactive to light and accommodation. No scleral icterus. Extraocular muscles intact.  HEENT: Head atraumatic, normocephalic. Oropharynx and nasopharynx clear.  NECK:  Supple, no jugular venous distention. No thyroid  enlargement, no tenderness.  LUNGS: Normal breath sounds bilaterally, no wheezing, rales,rhonchi or crepitation. No use of accessory muscles of respiration.  CARDIOVASCULAR: Regular rate and rhythm, S1, S2 normal. No murmurs, rubs, or gallops.  ABDOMEN: Soft, nondistended, nontender. Bowel sounds present. No organomegaly or mass.  EXTREMITIES: No pedal edema, cyanosis, or clubbing.  NEUROLOGIC: Cranial nerves II through XII are intact. Muscle strength 5/5 in all extremities. Sensation intact. Gait not checked.  PSYCHIATRIC: The patient  is alert and oriented x 3.  Normal affect and good eye contact. SKIN: No obvious rash, lesion, or ulcer.   LABORATORY PANEL:   CBC No results for input(s): WBC, HGB, HCT, PLT in the last 168  hours. ------------------------------------------------------------------------------------------------------------------  Chemistries  No results for input(s): NA, K, CL, CO2, GLUCOSE, BUN, CREATININE, CALCIUM , MG, AST, ALT, ALKPHOS, BILITOT in the last 168 hours.  Invalid input(s): GFRCGP ------------------------------------------------------------------------------------------------------------------  Cardiac Enzymes No results for input(s): TROPONINI in the last 168 hours. ------------------------------------------------------------------------------------------------------------------  RADIOLOGY:  No results found.    IMPRESSION AND PLAN:  Assessment and Plan: No notes have been filed under this hospital service. Service: Hospitalist      DVT prophylaxis: SCDs.  Medical prophylaxis is postponed till postoperative period. Advanced Care Planning:  Code Status: full code. Family Communication:  The plan of care was discussed in details with the patient (and family). I answered all questions. The patient agreed to proceed with the above mentioned plan. Further management will depend upon hospital course. Disposition Plan: Back to previous home environment Consults called: Orthopedic surgery All the records are reviewed and case discussed with ED provider.  Status is: Inpatient  At the time of the admission, it appears that the appropriate admission status for this patient is inpatient.  This is judged to be reasonable and necessary in order to provide the required intensity of service to ensure the patient's safety given the presenting symptoms, physical exam findings and initial radiographic and laboratory data in the context of comorbid conditions.  The patient requires inpatient status due to high intensity of service, high risk of further deterioration and high frequency of surveillance required.  I certify that at the time of admission, it is  my clinical judgment that the patient will require inpatient hospital care extending more than 2 midnights.                            Dispo: The patient is from: Home              Anticipated d/c is to: Home              Patient currently is not medically stable to d/c.              Difficult to place patient: No  Victoria Gaines.D on 03/22/2024 at 10:39 PM  [facility]   From 7 PM-7 AM, contact night-coverage [email or web address]  CC: Primary care physician; Gaines, Victoria C       [1] Allergies Allergen Reactions   Macrodantin [Nitrofurantoin Macrocrystal] Rash  "
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
## 2024-03-23 ENCOUNTER — Encounter (HOSPITAL_COMMUNITY)
Admission: EM | Disposition: A | Discharge status: Home Health | Payer: Self-pay | Source: Other Acute Inpatient Hospital | Attending: Internal Medicine

## 2024-03-23 ENCOUNTER — Inpatient Hospital Stay (HOSPITAL_COMMUNITY)

## 2024-03-23 ENCOUNTER — Encounter (HOSPITAL_COMMUNITY): Payer: Self-pay | Admitting: Family Medicine

## 2024-03-23 ENCOUNTER — Inpatient Hospital Stay (HOSPITAL_COMMUNITY): Admitting: Anesthesiology

## 2024-03-23 DIAGNOSIS — F32A Depression, unspecified: Secondary | ICD-10-CM | POA: Insufficient documentation

## 2024-03-23 DIAGNOSIS — M25531 Pain in right wrist: Secondary | ICD-10-CM

## 2024-03-23 DIAGNOSIS — J449 Chronic obstructive pulmonary disease, unspecified: Secondary | ICD-10-CM

## 2024-03-23 DIAGNOSIS — S72001A Fracture of unspecified part of neck of right femur, initial encounter for closed fracture: Secondary | ICD-10-CM

## 2024-03-23 DIAGNOSIS — E785 Hyperlipidemia, unspecified: Secondary | ICD-10-CM

## 2024-03-23 HISTORY — PX: PERCUTANEOUS PINNING: SHX2209

## 2024-03-23 LAB — BASIC METABOLIC PANEL WITH GFR
Anion gap: 7 (ref 5–15)
BUN: 15 mg/dL (ref 8–23)
CO2: 26 mmol/L (ref 22–32)
Calcium: 9.3 mg/dL (ref 8.9–10.3)
Chloride: 106 mmol/L (ref 98–111)
Creatinine, Ser: 0.73 mg/dL (ref 0.44–1.00)
GFR, Estimated: >60 mL/min
Glucose, Bld: 164 mg/dL — ABNORMAL HIGH (ref 70–99)
Potassium: 3.6 mmol/L (ref 3.5–5.1)
Sodium: 139 mmol/L (ref 135–145)

## 2024-03-23 LAB — CBC
HCT: 36.9 % (ref 36.0–46.0)
HCT: 39.8 % (ref 36.0–46.0)
Hemoglobin: 12.3 g/dL (ref 12.0–15.0)
Hemoglobin: 12.8 g/dL (ref 12.0–15.0)
MCH: 31.9 pg (ref 26.0–34.0)
MCH: 31.9 pg (ref 26.0–34.0)
MCHC: 33.3 g/dL (ref 30.0–36.0)
MCHC: 32.2 g/dL (ref 30.0–36.0)
MCV: 95.6 fL (ref 80.0–100.0)
MCV: 99.3 fL (ref 80.0–100.0)
Platelets: 230 K/uL (ref 150–400)
Platelets: 209 K/uL (ref 150–400)
RBC: 3.86 MIL/uL — ABNORMAL LOW (ref 3.87–5.11)
RBC: 4.01 MIL/uL (ref 3.87–5.11)
RDW: 14 % (ref 11.5–15.5)
RDW: 14.1 % (ref 11.5–15.5)
WBC: 9.7 K/uL (ref 4.0–10.5)
WBC: 13.1 K/uL — ABNORMAL HIGH (ref 4.0–10.5)
nRBC: 0 % (ref 0.0–0.2)
nRBC: 0 % (ref 0.0–0.2)

## 2024-03-23 LAB — SURGICAL PCR SCREEN
MRSA, PCR: NEGATIVE
Staphylococcus aureus: NEGATIVE

## 2024-03-23 LAB — CREATININE, SERUM
Creatinine, Ser: 0.73 mg/dL (ref 0.44–1.00)
GFR, Estimated: >60 mL/min

## 2024-03-23 SURGERY — PINNING, EXTREMITY, PERCUTANEOUS
Anesthesia: General | Site: Hip | Laterality: Right

## 2024-03-23 MED ORDER — PROPOFOL 10 MG/ML IV BOLUS
INTRAVENOUS | Status: AC
  Filled 2024-03-23: qty 20

## 2024-03-23 MED ORDER — PROPOFOL 10 MG/ML IV BOLUS
INTRAVENOUS | Status: DC | PRN
  Administered 2024-03-23: 100 mg via INTRAVENOUS

## 2024-03-23 MED ORDER — CHLORHEXIDINE GLUCONATE 0.12 % MT SOLN: 15.0000 mL | Freq: Once | OROMUCOSAL | Status: AC

## 2024-03-23 MED ORDER — CHLORHEXIDINE GLUCONATE 4 % EX SOLN
60.0000 mL | Freq: Once | CUTANEOUS | Status: AC
  Administered 2024-03-23: 4 via TOPICAL
  Filled 2024-03-23: qty 15

## 2024-03-23 MED ORDER — ADULT MULTIVITAMIN W/MINERALS CH
1.0000 | ORAL_TABLET | Freq: Every day | ORAL | Status: DC
  Administered 2024-03-23 – 2024-03-25 (×3): 1 via ORAL
  Filled 2024-03-23 (×3): qty 1

## 2024-03-23 MED ORDER — CEFAZOLIN SODIUM-DEXTROSE 2-4 GM/100ML-% IV SOLN
2.0000 g | INTRAVENOUS | Status: AC
  Administered 2024-03-23: 2 g via INTRAVENOUS
  Filled 2024-03-23: qty 100

## 2024-03-23 MED ORDER — LACTATED RINGERS IV SOLN: INTRAVENOUS | Status: DC

## 2024-03-23 MED ORDER — POVIDONE-IODINE 10 % EX SWAB
2.0000 | Freq: Once | CUTANEOUS | Status: AC
  Administered 2024-03-23: 2 via TOPICAL

## 2024-03-23 MED ORDER — DOCUSATE SODIUM 100 MG PO CAPS
100.0000 mg | ORAL_CAPSULE | Freq: Two times a day (BID) | ORAL | Status: DC
  Administered 2024-03-23 – 2024-03-25 (×5): 100 mg via ORAL
  Filled 2024-03-23 (×5): qty 1

## 2024-03-23 MED ORDER — PHENYLEPHRINE 80 MCG/ML (10ML) SYRINGE FOR IV PUSH (FOR BLOOD PRESSURE SUPPORT)
PREFILLED_SYRINGE | INTRAVENOUS | Status: DC | PRN
  Administered 2024-03-23: 160 ug via INTRAVENOUS

## 2024-03-23 MED ORDER — ORAL CARE MOUTH RINSE: 15.0000 mL | Freq: Once | OROMUCOSAL | Status: AC

## 2024-03-23 MED ORDER — DEXAMETHASONE SOD PHOSPHATE PF 10 MG/ML IJ SOLN
INTRAMUSCULAR | Status: DC | PRN
  Administered 2024-03-23: 5 mg via INTRAVENOUS

## 2024-03-23 MED ORDER — CEFAZOLIN SODIUM-DEXTROSE 2-4 GM/100ML-% IV SOLN
2.0000 g | Freq: Four times a day (QID) | INTRAVENOUS | Status: AC
  Administered 2024-03-23 – 2024-03-24 (×3): 2 g via INTRAVENOUS
  Filled 2024-03-23 (×3): qty 100

## 2024-03-23 MED ORDER — FENTANYL CITRATE (PF) 100 MCG/2ML IJ SOLN
INTRAMUSCULAR | Status: DC | PRN
  Administered 2024-03-23 (×2): 50 ug via INTRAVENOUS

## 2024-03-23 MED ORDER — ROCURONIUM BROMIDE 10 MG/ML (PF) SYRINGE
PREFILLED_SYRINGE | INTRAVENOUS | Status: AC
  Filled 2024-03-23: qty 10

## 2024-03-23 MED ORDER — ROCURONIUM BROMIDE 10 MG/ML (PF) SYRINGE
PREFILLED_SYRINGE | INTRAVENOUS | Status: DC | PRN
  Administered 2024-03-23: 50 mg via INTRAVENOUS

## 2024-03-23 MED ORDER — CHLORHEXIDINE GLUCONATE 0.12 % MT SOLN
OROMUCOSAL | Status: AC
  Administered 2024-03-23: 15 mL via OROMUCOSAL
  Filled 2024-03-23: qty 15

## 2024-03-23 MED ORDER — 0.9 % SODIUM CHLORIDE (POUR BTL) OPTIME
TOPICAL | Status: DC | PRN
  Administered 2024-03-23: 1000 mL

## 2024-03-23 MED ORDER — LIDOCAINE 2% (20 MG/ML) 5 ML SYRINGE
INTRAMUSCULAR | Status: DC | PRN
  Administered 2024-03-23: 60 mg via INTRAVENOUS

## 2024-03-23 MED ORDER — PROPOFOL 500 MG/50ML IV EMUL
INTRAVENOUS | Status: DC | PRN
  Administered 2024-03-23: 125 ug/kg/min via INTRAVENOUS

## 2024-03-23 MED ORDER — ONDANSETRON HCL 4 MG/2ML IJ SOLN
INTRAMUSCULAR | Status: DC | PRN
  Administered 2024-03-23: 4 mg via INTRAVENOUS

## 2024-03-23 MED ORDER — FENTANYL CITRATE (PF) 100 MCG/2ML IJ SOLN: 25.0000 ug | INTRAMUSCULAR | Status: DC | PRN

## 2024-03-23 MED ORDER — SUGAMMADEX SODIUM 200 MG/2ML IV SOLN
INTRAVENOUS | Status: AC
  Filled 2024-03-23: qty 2

## 2024-03-23 MED ORDER — ONDANSETRON HCL 4 MG PO TABS: 4.0000 mg | ORAL_TABLET | Freq: Four times a day (QID) | ORAL | Status: DC | PRN

## 2024-03-23 MED ORDER — SUGAMMADEX SODIUM 200 MG/2ML IV SOLN
INTRAVENOUS | Status: DC | PRN
  Administered 2024-03-23: 200 mg via INTRAVENOUS

## 2024-03-23 MED ORDER — ENOXAPARIN SODIUM 40 MG/0.4ML IJ SOSY
40.0000 mg | PREFILLED_SYRINGE | INTRAMUSCULAR | Status: DC
  Administered 2024-03-24 – 2024-03-25 (×2): 40 mg via SUBCUTANEOUS
  Filled 2024-03-23 (×2): qty 0.4

## 2024-03-23 MED ORDER — ENSURE PLUS HIGH PROTEIN PO LIQD
237.0000 mL | Freq: Two times a day (BID) | ORAL | Status: DC
  Administered 2024-03-24: 237 mL via ORAL

## 2024-03-23 MED ORDER — FENTANYL CITRATE (PF) 100 MCG/2ML IJ SOLN
INTRAMUSCULAR | Status: AC
  Filled 2024-03-23: qty 2

## 2024-03-23 MED ORDER — ONDANSETRON HCL 4 MG/2ML IJ SOLN
INTRAMUSCULAR | Status: AC
  Filled 2024-03-23: qty 2

## 2024-03-23 MED ORDER — LIDOCAINE 2% (20 MG/ML) 5 ML SYRINGE
INTRAMUSCULAR | Status: AC
  Filled 2024-03-23: qty 5

## 2024-03-23 MED ORDER — PHENYLEPHRINE HCL-NACL 20-0.9 MG/250ML-% IV SOLN
INTRAVENOUS | Status: DC | PRN
  Administered 2024-03-23: 55 ug/min via INTRAVENOUS

## 2024-03-23 MED ORDER — ONDANSETRON HCL 4 MG/2ML IJ SOLN: 4.0000 mg | Freq: Four times a day (QID) | INTRAMUSCULAR | Status: DC | PRN

## 2024-03-23 SURGICAL SUPPLY — 45 items
6.5 Cannulated Screw 75 x 22 mm Partially Threaded IMPLANT
6.5 Cannulated Screw 75 x 46 Partialy Threaded IMPLANT
BAG COUNTER SPONGE SURGICOUNT (BAG) ×1 IMPLANT
BENZOIN TINCTURE PRP APPL 2/3 (GAUZE/BANDAGES/DRESSINGS) IMPLANT
BIT DRILL 3.2 QUICK MINI 300 (DRILL) IMPLANT
BIT DRILL 5.0 QC 6.5 (BIT) IMPLANT
BLADE CLIPPER SURG (BLADE) IMPLANT
BNDG ELASTIC 3INX 5YD STR LF (GAUZE/BANDAGES/DRESSINGS) ×1 IMPLANT
BNDG ELASTIC 4X5.8 VLCR STR LF (GAUZE/BANDAGES/DRESSINGS) IMPLANT
BNDG GAUZE DERMACEA FLUFF 4 (GAUZE/BANDAGES/DRESSINGS) ×1 IMPLANT
BRUSH SCRUB EZ 4% CHG (MISCELLANEOUS) ×1 IMPLANT
BRUSH SCRUB EZ PLAIN DRY (MISCELLANEOUS) ×2 IMPLANT
COVER SURGICAL LIGHT HANDLE (MISCELLANEOUS) ×2 IMPLANT
CUFF TOURN SGL QUICK 18X4 (TOURNIQUET CUFF) IMPLANT
CUFF TRNQT CYL 24X4X16.5-23 (TOURNIQUET CUFF) IMPLANT
DRAPE C-ARMOR (DRAPES) ×1 IMPLANT
DRESSING MEPILEX FLEX 4X4 (GAUZE/BANDAGES/DRESSINGS) IMPLANT
DRSG EMULSION OIL 3X3 NADH (GAUZE/BANDAGES/DRESSINGS) IMPLANT
DURAPREP 26ML APPLICATOR (WOUND CARE) IMPLANT
GAUZE SPONGE 4X4 12PLY STRL (GAUZE/BANDAGES/DRESSINGS) IMPLANT
GAUZE XEROFORM 1X8 LF (GAUZE/BANDAGES/DRESSINGS) IMPLANT
GLOVE BIO SURGEON STRL SZ8 (GLOVE) ×2 IMPLANT
GLOVE BIOGEL PI IND STRL 7.5 (GLOVE) ×1 IMPLANT
GLOVE BIOGEL PI IND STRL 8 (GLOVE) ×1 IMPLANT
GLOVE SURG ORTHO LTX SZ7.5 (GLOVE) ×2 IMPLANT
GOWN STRL REUS W/ TWL LRG LVL3 (GOWN DISPOSABLE) ×2 IMPLANT
GOWN STRL REUS W/ TWL XL LVL3 (GOWN DISPOSABLE) ×1 IMPLANT
KIT BASIN OR (CUSTOM PROCEDURE TRAY) ×1 IMPLANT
KIT TURNOVER KIT B (KITS) ×1 IMPLANT
MANIFOLD NEPTUNE II (INSTRUMENTS) ×1 IMPLANT
PACK GENERAL/GYN (CUSTOM PROCEDURE TRAY) IMPLANT
PACK ORTHO EXTREMITY (CUSTOM PROCEDURE TRAY) ×1 IMPLANT
PAD ARMBOARD POSITIONER FOAM (MISCELLANEOUS) ×2 IMPLANT
PIN GUIDE 3.2X300MM (PIN) IMPLANT
SCREW CANN NL 6.5X75X46 (Screw) IMPLANT
SCREW CANN NLOCK 6.5X75X22 (Screw) IMPLANT
SOLN 0.9% NACL POUR BTL 1000ML (IV SOLUTION) ×1 IMPLANT
SOLN STERILE WATER BTL 1000 ML (IV SOLUTION) ×1 IMPLANT
STAPLER SKIN PROX WIDE 3.9 (STAPLE) IMPLANT
STRIP CLOSURE SKIN 1/2X4 (GAUZE/BANDAGES/DRESSINGS) IMPLANT
SUT NYLON ETHILON 5-0 P-3 1X18 (SUTURE) IMPLANT
SUT PROLENE 4 0 P 3 18 (SUTURE) IMPLANT
SUT VIC AB 1 CT1 27XBRD ANBCTR (SUTURE) IMPLANT
TOWEL GREEN STERILE (TOWEL DISPOSABLE) ×2 IMPLANT
TOWEL GREEN STERILE FF (TOWEL DISPOSABLE) ×1 IMPLANT

## 2024-03-23 NOTE — Progress Notes (Signed)
 "    Progress Note    Victoria Gaines  FMW:[id number] DOB: May 03, 1945  DOA: 03/22/2024 PCP: Hairfield, Keavie C      Brief Narrative:    Medical records reviewed and are as summarized below:  Victoria Gaines is a 78 y.o. female with medical history significant for COPD, hypothyroidism, dyslipidemia, depression, who presented to the hospital because of pain in the right hip after a fall.  She said she lost her balance while turning and walking towards her husband's car.  She fell on her right side when she fell.  She developed severe right hip pain after the fall.  Workup in the ED revealed right femoral neck fracture.      Assessment/Plan:   Principal Problem:   Closed right hip fracture, initial encounter [facility]) Active Problems:   Hypothyroidism   Dyslipidemia   Chronic obstructive pulmonary disease (COPD) (HCC)   Right wrist pain   Depression   Nutrition Problem: Increased nutrient needs Etiology: hip fracture  Signs/Symptoms: estimated needs   Body mass index is 24.59 kg/m.  Right femoral neck fracture: Plan for right hip surgical repair today.  Analgesia as needed for pain.  Follow-up with orthopedic surgeon.   Right wrist pain: Analgesics as needed for pain.  X-ray did not show any evidence of acute fracture but there is mild osteoarthritis of the first carpometacarpal joint.   COPD: Stable.  Continue bronchodilators as needed.   Comorbidities include dyslipidemia, hypothyroidism, depression   Diet Order             Diet NPO time specified  Diet effective ____           Diet NPO time specified Except for: Sips with Meds  Diet effective now                                  Consultants: Orthopedic surgeon  Procedures: Plan for right hip surgical repair    Medications:    [MAR Hold] feeding supplement  237 mL Oral BID BM   [MAR Hold] levothyroxine   125 mcg Oral Q0600   [MAR Hold] multivitamin with minerals  1 tablet Oral Daily    [MAR Hold] pneumococcal 20-valent conjugate vaccine  0.5 mL Intramuscular Tomorrow-1000   [MAR Hold] rosuvastatin   5 mg Oral Daily   [MAR Hold] venlafaxine  XR  225 mg Oral Q breakfast   Continuous Infusions:  [START ON 03/24/2024]  ceFAZolin  (ANCEF ) IV     lactated ringers        Anti-infectives (From admission, onward)    Start     Dose/Rate Route Frequency Ordered Stop   03/24/24 0600  ceFAZolin  (ANCEF ) IVPB 2g/100 mL premix        2 g 200 mL/hr over 30 Minutes Intravenous On call to O.R. 03/23/24 1131 03/25/24 0559              Family Communication/Anticipated D/C date and plan/Code Status   DVT prophylaxis: SCDs Start: 03/22/24 2236     Code Status: Full Code  Family Communication: None Disposition Plan: To be determined.   Status is: Inpatient Remains inpatient appropriate because: Right hip fracture       Subjective:   Interval events noted.  She complains of pain in the right hip.  No other complaints.  Objective:    Vitals:   03/22/24 2343 03/23/24 0348 03/23/24 0740 03/23/24 1209  BP: 126/70 104/61 119/61 126/65  Pulse: 92 85  76 76  Resp: 18 18 18 18   Temp: 100.1 F (37.8 C) 99.3 F (37.4 C) 98.9 F (37.2 C) 99.6 F (37.6 C)  TempSrc: Oral Oral  Oral  SpO2: 93% 95% 91% 93%  Weight:    69.1 kg  Height:    5' 6 (1.676 m)   No data found.   Intake/Output Summary (Last 24 hours) at 03/23/2024 1240 Last data filed at 03/23/2024 0824 Gross per 24 hour  Intake 60 ml  Output 300 ml  Net -240 ml   Filed Weights   03/22/24 2128 03/23/24 1209  Weight: 69.1 kg 69.1 kg    Exam:  GEN: NAD SKIN: Warm and dry EYES: No pallor or icterus ENT: MMM CV: RRR PULM: CTA B ABD: soft, ND, NT, +BS CNS: AAO x 3, non focal EXT: Bruises on right hip.  Tenderness on right hip.        Data Reviewed:   I have personally reviewed following labs and imaging studies:  Labs: Labs show the following:   Basic Metabolic Panel: Recent  Labs  Lab 03/23/24 0306  NA 139  K 3.6  CL 106  CO2 26  GLUCOSE 164*  BUN 15  CREATININE 0.73  CALCIUM  9.3   GFR Estimated Creatinine Clearance: 56.9 mL/min (by C-G formula based on SCr of 0.73 mg/dL). Liver Function Tests: No results for input(s): AST, ALT, ALKPHOS, BILITOT, PROT, ALBUMIN in the last 168 hours. No results for input(s): LIPASE, AMYLASE in the last 168 hours. No results for input(s): AMMONIA in the last 168 hours. Coagulation profile No results for input(s): INR, PROTIME in the last 168 hours.  CBC: Recent Labs  Lab 03/23/24 0306  WBC 9.7  HGB 12.3  HCT 36.9  MCV 95.6  PLT 230   Cardiac Enzymes: No results for input(s): CKTOTAL, CKMB, CKMBINDEX, TROPONINI in the last 168 hours. BNP (last 3 results) No results for input(s): PROBNP in the last 8760 hours. CBG: No results for input(s): GLUCAP in the last 168 hours. D-Dimer: No results for input(s): DDIMER in the last 72 hours. Hgb A1c: No results for input(s): HGBA1C in the last 72 hours. Lipid Profile: No results for input(s): CHOL, HDL, LDLCALC, TRIG, CHOLHDL, LDLDIRECT in the last 72 hours. Thyroid  function studies: No results for input(s): TSH, T4TOTAL, T3FREE, THYROIDAB in the last 72 hours.  Invalid input(s): FREET3 Anemia work up: No results for input(s): VITAMINB12, FOLATE, FERRITIN, TIBC, IRON, RETICCTPCT in the last 72 hours. Sepsis Labs: Recent Labs  Lab 03/23/24 0306  WBC 9.7    Microbiology Recent Results (from the past 240 hours)  Surgical PCR screen     Status: None   Collection Time: 03/23/24  1:20 AM   Specimen: Nasal Mucosa; Nasal Swab  Result Value Ref Range Status   MRSA, PCR NEGATIVE NEGATIVE Final   Staphylococcus aureus NEGATIVE NEGATIVE Final    Comment: (NOTE) The Xpert SA Assay (FDA approved for NASAL specimens in patients 81 years of age and older), is one component of a  comprehensive surveillance program. It is not intended to diagnose infection nor to guide or monitor treatment. Performed at [facility], [address] [place], [place] [address]     Procedures and diagnostic studies:  DG Wrist Complete Right Result Date: 03/23/2024 CLINICAL DATA:  Right wrist pain and swelling after fall EXAM: RIGHT WRIST - COMPLETE 3+ VIEW COMPARISON:  None Available. FINDINGS: There is no evidence of fracture or dislocation. Mild narrowing of first carpometacarpal  joint is noted. Soft tissues are unremarkable. IMPRESSION: Mild osteoarthritis of first carpometacarpal joint. No acute abnormality seen. Electronically Signed   By: Lynwood Landy Raddle M.D.   On: 03/23/2024 08:54   DG HIP PORT UNILAT WITH PELVIS 1V RIGHT Result Date: 03/23/2024 CLINICAL DATA:  Right femoral neck fracture EXAM: DG HIP (WITH OR WITHOUT PELVIS) 1V PORT RIGHT COMPARISON:  April 30, 2013 FINDINGS: Mildly displaced and possibly comminuted fracture is seen involving the right femoral neck. IMPRESSION: Mildly displaced and possibly comminuted right femoral neck fracture. Electronically Signed   By: Lynwood Landy Raddle M.D.   On: 03/23/2024 08:52               LOS: 1 day   Dniyah Grant  [facility]   Pager on [email or web address]. If 7PM-7AM, please contact night-coverage at [email or web address]     03/23/2024, 12:40 PM           "

## 2024-03-23 NOTE — Transfer of Care (Signed)
 Immediate Anesthesia Transfer of Care Note  Patient: Victoria Gaines  Procedure(s) Performed: PINNING, EXTREMITY, PERCUTANEOUS (Right: Hip)  Patient Location: PACU  Anesthesia Type:General  Level of Consciousness: awake, alert , and oriented  Airway & Oxygen Therapy: Patient Spontanous Breathing and Patient connected to face mask oxygen  Post-op Assessment: Report given to RN and Post -op Vital signs reviewed and stable  Post vital signs: Reviewed and stable  Last Vitals:  Vitals Value Taken Time  BP 108/70 03/23/24 13:39  Temp 36.8 C 03/23/24 13:36  Pulse 100 03/23/24 13:44  Resp 18 03/23/24 13:44  SpO2 94 % 03/23/24 13:44  Vitals shown include unfiled device data.  Last Pain:  Vitals:   03/23/24 1336  TempSrc:   PainSc: 0-No pain      Patients Stated Pain Goal: 0 (03/23/24 0824)  Complications: No notable events documented.

## 2024-03-23 NOTE — Assessment & Plan Note (Signed)
Will continue Effexor XR.

## 2024-03-23 NOTE — Plan of Care (Signed)

## 2024-03-23 NOTE — Assessment & Plan Note (Signed)
-   Will continue statin therapy that should provide perioperative cardiovascular risk reduction.

## 2024-03-23 NOTE — Plan of Care (Signed)
   Problem: Education: Goal: Knowledge of General Education information will improve Description: Including pain rating scale, medication(s)/side effects and non-pharmacologic comfort measures Outcome: Progressing   Problem: Clinical Measurements: Goal: Ability to maintain clinical measurements within normal limits will improve Outcome: Progressing   Problem: Activity: Goal: Risk for activity intolerance will decrease Outcome: Progressing   Problem: Nutrition: Goal: Adequate nutrition will be maintained Outcome: Progressing   Problem: Pain Managment: Goal: General experience of comfort will improve and/or be controlled Outcome: Progressing   Problem: Safety: Goal: Ability to remain free from injury will improve Outcome: Progressing   Problem: Skin Integrity: Goal: Risk for impaired skin integrity will decrease Outcome: Progressing

## 2024-03-23 NOTE — Assessment & Plan Note (Signed)
-   Will continue bronchodilator therapy.

## 2024-03-23 NOTE — Progress Notes (Signed)
 Initial Nutrition Assessment  DOCUMENTATION CODES:  Not applicable  INTERVENTION:  Advance diet as tolerated to regular diet. Do not restrict diet so as to encourage PO intake. Ensure Plus High Protein PO BID. Each supplement provides 350 Kcals and 20 grams of protein. Multivitamin PO once daily.   NUTRITION DIAGNOSIS:  Increased nutrient needs related to hip fracture as evidenced by estimated needs   GOAL:  Patient will meet greater than or equal to 90% of their needs   MONITOR:  PO intake, Supplement acceptance, Diet advancement, Labs, Weight trends, I & O's  REASON FOR ASSESSMENT:  Consult Hip fracture protocol  ASSESSMENT:  Patient presented with displaced femoral neck fracture s/p fall. PMH significant for dyslipidemia, hypothyroidism, and depression.  RD on remote coverage. Attempted phone call to patient was unanswered. Of note, the patient had an MD office visit 07/2023 which revealed the patient is an active smoker with suspicion of COPD.  Scheduled Meds:  levothyroxine   125 mcg Oral Q0600   pneumococcal 20-valent conjugate vaccine  0.5 mL Intramuscular Tomorrow-1000   rosuvastatin   5 mg Oral Daily   venlafaxine  XR  225 mg Oral Q breakfast   Continuous Infusions: PRN Meds:.acetaminophen , albuterol , HYDROcodone -acetaminophen , magnesium  hydroxide, morphine  injection, ondansetron  (ZOFRAN ) IV, mouth rinse, traZODone   Diet Order             Diet NPO time specified  Diet effective ____           Diet NPO time specified Except for: Sips with Meds  Diet effective now                  Meal Intake: N/A  Labs:     Latest Ref Rng & Units 03/23/2024    3:06 AM 05/03/2013    6:18 AM 05/02/2013    5:44 AM  CMP  Glucose 70 - 99 mg/dL 835  891  877   BUN 8 - 23 mg/dL 15  9  9    Creatinine 0.44 - 1.00 mg/dL 9.26  9.49  9.43   Sodium 135 - 145 mmol/L 139  138  139   Potassium 3.5 - 5.1 mmol/L 3.6  3.7  3.8   Chloride 98 - 111 mmol/L 106  103  106   CO2 22 - 32  mmol/L 26  25  23    Calcium  8.9 - 10.3 mg/dL 9.3  8.4  7.9     I/O: -300 mL since admit  NUTRITION - FOCUSED PHYSICAL EXAM: Deferred due to remote RD coverage  EDUCATION NEEDS:  Not appropriate for education at this time  Skin:  Skin Assessment: Reviewed RN Assessment  Last BM:  none charted  Height:  Ht Readings from Last 1 Encounters:  03/22/24 5' 6 (1.676 m)   Weight:  Wt Readings from Last 10 Encounters:  03/22/24 69.1 kg  08/21/23 72.1 kg   Weight Change: 3 Kg (4%) loss in 6 months - not clinically significant, will update with additional data when available.  Usual Body Weight: unable to determine  Edema: none  Ideal Body Weight:  59 kg   BMI:  Body mass index is 24.58 kg/m.  Estimated Daily Nutritional Needs:  Kcal:  1600-1800 Protein:  90-110 g Fluid:  >/=1600 mL    Leverne Ruth, MS, RDN, LDN [place] See AMION for contact information Secure chat preferred

## 2024-03-23 NOTE — Anesthesia Preprocedure Evaluation (Addendum)
"                                    Anesthesia Evaluation  Patient identified by MRN, date of birth, ID band Patient awake    Reviewed: Allergy & Precautions, H&P , NPO status , Patient's Chart, lab work & pertinent test results  Airway Mallampati: I  TM Distance: >3 FB Neck ROM: Full    Dental no notable dental hx. (+) Edentulous Upper, Edentulous Lower, Dental Advisory Given   Pulmonary COPD,  COPD inhaler, Current Smoker and Patient abstained from smoking.   Pulmonary exam normal breath sounds clear to auscultation       Cardiovascular negative cardio ROS  Rhythm:Regular Rate:Normal     Neuro/Psych    Depression    negative neurological ROS     GI/Hepatic negative GI ROS, Neg liver ROS,,,  Endo/Other  Hypothyroidism    Renal/GU negative Renal ROS  negative genitourinary   Musculoskeletal   Abdominal   Peds  Hematology negative hematology ROS (+)   Anesthesia Other Findings   Reproductive/Obstetrics negative OB ROS                              Anesthesia Physical Anesthesia Plan  ASA: 2  Anesthesia Plan: General   Post-op Pain Management: Tylenol  PO (pre-op)*   Induction: Intravenous  PONV Risk Score and Plan: 3 and Ondansetron , Dexamethasone , Propofol  infusion and TIVA  Airway Management Planned: Oral ETT  Additional Equipment:   Intra-op Plan:   Post-operative Plan: Extubation in OR  Informed Consent: I have reviewed the patients History and Physical, chart, labs and discussed the procedure including the risks, benefits and alternatives for the proposed anesthesia with the patient or authorized representative who has indicated his/her understanding and acceptance.     Dental advisory given  Plan Discussed with: CRNA  Anesthesia Plan Comments:          Anesthesia Quick Evaluation  "

## 2024-03-23 NOTE — Op Note (Signed)
 DATE OF SURGERY:  03/23/2024  TIME: 1:36 PM  PATIENT NAME:  Victoria Gaines  AGE: 78 y.o.  PRE-OPERATIVE DIAGNOSIS:  closed displaced fracture  POST-OPERATIVE DIAGNOSIS:  SAME  PROCEDURE:  PINNING, EXTREMITY, PERCUTANEOUS  SURGEON:  Cordella SHAUNNA Rhein  ASSISTANT:   OPERATIVE IMPLANTS:  Implant Name Type Inv. Item Serial No. Manufacturer Lot No. LRB No. Used Action  6.5 Cannulated Screw 75 x 46 Partialy Threaded      Right 2 Implanted  6.5 Cannulated Screw 75 x 22 mm Partially Threaded      Right 1 Implanted    UNIQUE ASPECTS OF THE CASE:  none  ESTIMATED BLOOD LOSS: 50  PREOPERATIVE INDICATIONS:  Victoria Gaines is a 78 y.o. year old who fell and suffered an closed displaced fracture. She was brought into the ER and then admitted and optimized and then elected for surgical intervention.    The risks benefits and alternatives were discussed with the patient including but not limited to the risks of nonoperative treatment, versus surgical intervention including infection, bleeding, nerve injury, malunion, nonunion, hardware prominence, hardware failure, need for hardware removal, blood clots, cardiopulmonary complications, morbidity, mortality, among others, and they were willing to proceed.    OPERATIVE PROCEDURE:  The patient was brought to the operating room and placed in the supine position. Anesthesia was administered. She was placed on the fracture table.  Closed reduction was performed under C-arm guidance.  Time out was then performed after sterile prep and drape. She received preoperative antibiotics.  Small incision proximal to the greater trochanter was made and carried down through skin and subcutaneous tissue.  Threaded guidewire was directed at the tip of the greater trochanter and advanced into the proximal metaphysis.  Positioning was confirmed with fluoroscopy.  Gentle reduction was taken out using the fracture table.  We then first placed a percutaneous guidewire.  This was  advanced in the middle and inferior position and advanced up into the femoral neck.  Position was confirmed under both AP and lateral fluoroscopic imaging.  We then made an incision extending proximally through the fascia.  2 additional guidewires were placed 1 each in the anterior superior and posterior superior positions.  Once adequate positioned on a both AP and lateral fluoroscopic imaging, we measured for the screws.  We drilled the near cortex.  We then placed the 75 mm screws with 45 mm of threads into the superior 2 holes.  A 75 mm screw with a 22 mm of threading was placed inferiorly.  These had excellent purchase and bite.  Final fluoroscopic images demonstrate supposition of the screws. The wounds were irrigated copiously, and vancomycin powder was placed in the wounds.  The gluteal fascia was closed with #1 Vicryl, and skin was closed with 2-0 Vicryl and staples .  Sterile dressing was applied .  The patient was awakened and returned to PACU in stable and satisfactory condition. There were no complications and the patient tolerated the procedure well.   Post op recs: WB: WBAT right LE Abx: ancef  x23 hours post op Dressing: keep intact until follow up, change PRN if soiled or saturated. DVT prophylaxis: l aspirin  for 6 weeks Follow up: 2 weeks after surgery for a wound check with Dr. Rhein at [facility].  Address: [address], [place], [place] [address]  Office Phone: [phone]  Cordella Rhein, MD, MS [facility] Orthoped[facility] Specialist / [place] [phone]

## 2024-03-23 NOTE — Anesthesia Procedure Notes (Signed)
 Procedure Name: Intubation Date/Time: 03/23/2024 12:46 PM  Performed by: Mannie Krystal LABOR, CRNAPre-anesthesia Checklist: Patient identified, Emergency Drugs available, Suction available and Patient being monitored Patient Re-evaluated:Patient Re-evaluated prior to induction Oxygen Delivery Method: Circle system utilized Preoxygenation: Pre-oxygenation with 100% oxygen Induction Type: IV induction Ventilation: Mask ventilation without difficulty Laryngoscope Size: Miller and 2 Grade View: Grade I Tube type: Oral Tube size: 7.0 mm Number of attempts: 1 Airway Equipment and Method: Stylet Placement Confirmation: ETT inserted through vocal cords under direct vision, positive ETCO2 and breath sounds checked- equal and bilateral Secured at: 21 cm Tube secured with: Tape Dental Injury: Teeth and Oropharynx as per pre-operative assessment

## 2024-03-23 NOTE — Assessment & Plan Note (Signed)
-   Will obtain right wrist x-ray for further assessment. - Pain management will be provided.

## 2024-03-23 NOTE — Assessment & Plan Note (Signed)
-   The patient will be admitted to a medical-surgical bed. - Pain management will be provided. - Orthopedic surgery will be obtained. - Dr. Reyne was notified and is aware about the patient. - The patient has no history of CHF, kidney artery disease, diabetes mellitus on insulin, renal failure with creatinine more than 2 or CVA.  She is considered at average risk for her age for perioperative cardiovascular events per the revised cardiac risk index.  She has a history of COPD with no exacerbation or other active pulmonary issues.

## 2024-03-23 NOTE — Assessment & Plan Note (Signed)
-   Will continue Synthroid .

## 2024-03-23 NOTE — TOC CAGE-AID Note (Signed)
 Transition of Care [facility]) - CAGE-AID Screening  Patient Details  Name: Victoria Gaines MRN: [id number] Date of Birth: Jan 19, 1946  Clinical Narrative:  Patient denies any current alcohol  or drug use, every day cigarette smoker. Patient does not need any substance abuse resources at this time.  CAGE-AID Screening:   Have You Ever Felt You Ought to Cut Down on Your Drinking or Drug Use?: No Have People Annoyed You By Critizing Your Drinking Or Drug Use?: No Have You Felt Bad Or Guilty About Your Drinking Or Drug Use?: No Have You Ever Had a Drink or Used Drugs First Thing In The Morning to Steady Your Nerves or to Get Rid of a Hangover?: No CAGE-AID Score: 0  Substance Abuse Education Offered: No

## 2024-03-23 NOTE — Consult Note (Signed)
 Orthopedic Consult  Patient ID: Victoria Gaines MRN: [id number] DOB/AGE: [birth year] 78 y.o.  Reason for Consult: Right hip pain Referring Physician: Jens  HPI: Victoria Gaines is an 78 y.o. female who fell yesterday.  She walked into a car, and then fell over striking her right hip.  She was seen in the urgent emergency room and then transferred here.  She denies any antecedent right hip pain.  Denies any injuries also or fall.  Does have a history of prior left hip fracture.  Past Medical History:  Diagnosis Date   COPD (chronic obstructive pulmonary disease) (HCC)    Depression    Hypothyroid     Past Surgical History:  Procedure Laterality Date   APPENDECTOMY     INTRAMEDULLARY (IM) NAIL INTERTROCHANTERIC Left 05/01/2013   Procedure: INTRAMEDULLARY (IM) NAIL INTERTROCHANTRIC;  Surgeon: Kay Ozell Cummins, MD;  Location: [facility] OR;  Service: Orthopedics;  Laterality: Left;   KNEE SURGERY Right    VAGINAL HYSTERECTOMY      History reviewed. No pertinent family history.  Social History:  reports that she has been smoking cigarettes. She has never used smokeless tobacco. She reports that she does not currently use alcohol . She reports that she does not use drugs.  Allergies: Allergies[1]  Medications: I have reviewed the patient's current medications.    Exam: Blood pressure 126/65, pulse 76, temperature 99.6 F (37.6 C), temperature source Oral, resp. rate 18, height 5' 6 (1.676 m), weight 69.1 kg, SpO2 93%. General: Well-appearing woman no acute distress Orientation: Alert and oriented Mood and Affect: Mood is calm   Injured Extremity (CV, lymph, sensation, reflexes): Right leg is equal length of the left left leg and normally rotated.  She is tender about the hip.  No tense about the knee or ankle.  Intact sensation in the saphenous, sural, tibial, and peroneal nerve distributions.  5/5 strength in EHL, FHL, gastrocs, tibialis anterior  No tense outpatient crepitus defects was  about the bilateral upper extremities or left lower extremity   Medical Decision Making: Data: Imaging: X-rays show an impacted right femoral neck fracture  Labs: White cell count 9.7, H&H of 12.3/36.9    Medical history and chart was reviewed and case discussed with medical provider.  Assessment/Plan: Right impacted femoral neck fracture  The patient does have an impacted right femoral neck fracture.  This can be treated with a open reduction internal fixation with percutaneous screw fixation.  We discussed the risks of surgery including but limited bleeding infection injury to nerves, avascular porosis, nonunion, malunion, and the need for additional surgeries.  We also discussed the risk of blood clots.  Informed consent was obtained.  Will plan for surgery be done today.  New problem w/ workup planned: High complexity diagnosis (Level 5) Surgery w/ risks or Emergency surgery: High complexity Risk (Level 5)  All others are Level 4 with comprehensive musculoskeletal exam.  Cordella Rhein, MD, MS [facility] Specialist / [place] [phone]      [1]  Allergies Allergen Reactions   Macrodantin [Nitrofurantoin Macrocrystal] Rash

## 2024-03-24 DIAGNOSIS — S72001A Fracture of unspecified part of neck of right femur, initial encounter for closed fracture: Secondary | ICD-10-CM | POA: Diagnosis not present

## 2024-03-24 MED ORDER — DOCUSATE SODIUM 100 MG PO CAPS: 100.0000 mg | ORAL_CAPSULE | Freq: Two times a day (BID) | ORAL | 0 refills | Status: AC

## 2024-03-24 MED ORDER — UMECLIDINIUM-VILANTEROL 62.5-25 MCG/ACT IN AEPB
1.0000 | INHALATION_SPRAY | Freq: Every day | RESPIRATORY_TRACT | Status: DC
  Administered 2024-03-24 – 2024-03-25 (×2): 1 via RESPIRATORY_TRACT
  Filled 2024-03-24: qty 14

## 2024-03-24 MED ORDER — ASPIRIN 325 MG PO TBEC: 325.0000 mg | DELAYED_RELEASE_TABLET | Freq: Every day | ORAL | 0 refills | Status: AC

## 2024-03-24 MED ORDER — HYDROCODONE-ACETAMINOPHEN 5-325 MG PO TABS: 1.0000 | ORAL_TABLET | Freq: Four times a day (QID) | ORAL | 0 refills | Status: AC | PRN

## 2024-03-24 NOTE — Anesthesia Postprocedure Evaluation (Signed)
"   Anesthesia Post Note  Patient: Victoria Gaines  Procedure(s) Performed: PINNING, EXTREMITY, PERCUTANEOUS (Right: Hip)     Patient location during evaluation: PACU Anesthesia Type: General Level of consciousness: awake and alert Pain management: pain level controlled Vital Signs Assessment: post-procedure vital signs reviewed and stable Respiratory status: spontaneous breathing, nonlabored ventilation, respiratory function stable and patient connected to nasal cannula oxygen Cardiovascular status: blood pressure returned to baseline and stable Postop Assessment: no apparent nausea or vomiting Anesthetic complications: no   No notable events documented.                Kacper Cartlidge,W. EDMOND      "

## 2024-03-24 NOTE — Progress Notes (Signed)
 Orthopaedic Progress Note  S: The patient is sitting up in a chair.  She was able to ambulate.  She denies any significant pain from her hip  O:  Vitals:   03/24/24 0437 03/24/24 0746  BP: 112/67 (!) 100/55  Pulse: 67 65  Resp: 16 18  Temp: 98.5 F (36.9 C) 97.9 F (36.6 C)  SpO2: 94% 98%    Clean dressing on the right hip.  Thigh soft and compressible.  No calf tenderness.  Intact station in the saphenous, sural, tibial and peroneal nerve distributions.  5/5 strength EHL, FHL, gastrocs, 2000    Labs:  Results for orders placed or performed during the hospital encounter of 03/22/24 (from the past 24 hours)  CBC     Status: Abnormal   Collection Time: 03/23/24  3:00 PM  Result Value Ref Range   WBC 13.1 (H) 4.0 - 10.5 K/uL   RBC 4.01 3.87 - 5.11 MIL/uL   Hemoglobin 12.8 12.0 - 15.0 g/dL   HCT 60.1 63.9 - 53.9 %   MCV 99.3 80.0 - 100.0 fL   MCH 31.9 26.0 - 34.0 pg   MCHC 32.2 30.0 - 36.0 g/dL   RDW 85.8 88.4 - 84.4 %   Platelets 209 150 - 400 K/uL   nRBC 0.0 0.0 - 0.2 %  Creatinine, serum     Status: None   Collection Time: 03/23/24  3:00 PM  Result Value Ref Range   Creatinine, Ser 0.73 0.44 - 1.00 mg/dL   GFR, Estimated >39 >39 mL/min    Assessment: Postop day 1 status post percutaneous screw fixation of right hip fracture  The patient is doing well.  Pain is tolerable.  She is already been up and ambulating.  She may continue to weight-bear as tolerated.  Dressing changes can be as needed.  Will continue with aspirin  for DVT prophylaxis.  For an orthopedic standpoint, she can be discharged to home once cleared from therapy.  We discussed if she is discharged today, we have to try to find a pharmacy for her pain medications.  If not, she will do some tomorrow, she can pick them up at her pharmacy in [place].  She is to follow-up in the office in 2 weeks time.    Follow - up plan: 2 weeks   Cordella Rhein, MD, MS [facility] Orth[facility] Specialist /  [place] [phone]

## 2024-03-24 NOTE — Progress Notes (Signed)
 "    Progress Note    Victoria Gaines  FMW:[id number] DOB: [birth year]  DOA: 03/22/2024 PCP: Hairfield, Keavie C      Brief Narrative:    Medical records reviewed and are as summarized below:  Victoria Gaines is a 78 y.o. female with medical history significant for COPD, hypothyroidism, dyslipidemia, depression, who presented to the hospital because of pain in the right hip after a fall.  She said she lost her balance while turning and walking towards her husband's car.  She fell on her right side when she fell.  She developed severe right hip pain after the fall.  Workup in the ED revealed right femoral neck fracture.      Assessment/Plan:   Principal Problem:   Closed right hip fracture, initial encounter [facility]) Active Problems:   Hypothyroidism   Dyslipidemia   Chronic obstructive pulmonary disease (COPD) (HCC)   Right wrist pain   Depression   Nutrition Problem: Increased nutrient needs Etiology: hip fracture  Signs/Symptoms: estimated needs   Body mass index is 24.59 kg/m.  Right femoral neck fracture: S/p percutaneous pinning of right hip 03/24/2023 Analgesia as needed for pain.   Orthopedic surgery recommended aspirin  for DVT prophylaxis at discharge. PT recommended home health therapy Follow-up with orthopedic surgeon.   Right wrist pain: Analgesics as needed for pain.  X-ray did not show any evidence of acute fracture but there is mild osteoarthritis of the first carpometacarpal joint.   COPD: Stable.  Continue bronchodilators.   Comorbidities include dyslipidemia, hypothyroidism, depression   Diet Order             Diet regular Room service appropriate? Yes; Fluid consistency: Thin  Diet effective now                                  Consultants: Orthopedic surgeon  Procedures: S/p percutaneous pinning of right hip 03/24/2023    Medications:    docusate sodium   100 mg Oral BID   enoxaparin  (LOVENOX ) injection  40 mg  Subcutaneous Q24H   feeding supplement  237 mL Oral BID BM   levothyroxine   125 mcg Oral Q0600   multivitamin with minerals  1 tablet Oral Daily   pneumococcal 20-valent conjugate vaccine  0.5 mL Intramuscular Tomorrow-1000   rosuvastatin   5 mg Oral Daily   umeclidinium-vilanterol  1 puff Inhalation Daily   venlafaxine  XR  225 mg Oral Q breakfast   Continuous Infusions:     Anti-infectives (From admission, onward)    Start     Dose/Rate Route Frequency Ordered Stop   03/24/24 0600  ceFAZolin  (ANCEF ) IVPB 2g/100 mL premix        2 g 200 mL/hr over 30 Minutes Intravenous On call to O.R. 03/23/24 1131 03/23/24 2321   03/23/24 1545  ceFAZolin  (ANCEF ) IVPB 2g/100 mL premix        2 g 200 mL/hr over 30 Minutes Intravenous Every 6 hours 03/23/24 1450 03/24/24 0338              Family Communication/Anticipated D/C date and plan/Code Status   DVT prophylaxis: enoxaparin  (LOVENOX ) injection 40 mg Start: 03/24/24 0800 SCDs Start: 03/23/24 1451 SCDs Start: 03/22/24 2236     Code Status: Full Code  Family Communication: None Disposition Plan: To be determined.   Status is: Inpatient Remains inpatient appropriate because: Right hip fracture       Subjective:   She complains of  pain in the right hip.  She feels better today.  Objective:    Vitals:   03/23/24 2013 03/23/24 2300 03/24/24 0437 03/24/24 0746  BP: (!) 93/47 99/66 112/67 (!) 100/55  Pulse: 91 74 67 65  Resp: 19 18 16 18   Temp: 98.7 F (37.1 C) 98.4 F (36.9 C) 98.5 F (36.9 C) 97.9 F (36.6 C)  TempSrc: Oral Oral    SpO2: (!) 87% 96% 94% 98%  Weight:      Height:       No data found.   Intake/Output Summary (Last 24 hours) at 03/24/2024 1524 Last data filed at 03/24/2024 1133 Gross per 24 hour  Intake 660 ml  Output 800 ml  Net -140 ml   Filed Weights   03/22/24 2128 03/23/24 1209  Weight: 69.1 kg 69.1 kg    Exam:  GEN: NAD, sitting up in the chair SKIN: Warm and dry EYES: No  pallor or icterus ENT: MMM CV: RRR PULM: CTA B ABD: soft, ND, NT, +BS CNS: AAO x 3, non focal EXT: Right hip swelling and tenderness.  Bruises on the right hip.  Dressing on right hip surgical wound slightly bloodstained but mostly intact and dry       Data Reviewed:   I have personally reviewed following labs and imaging studies:  Labs: Labs show the following:   Basic Metabolic Panel: Recent Labs  Lab 03/23/24 0306 03/23/24 1500  NA 139  --   K 3.6  --   CL 106  --   CO2 26  --   GLUCOSE 164*  --   BUN 15  --   CREATININE 0.73 0.73  CALCIUM  9.3  --    GFR Estimated Creatinine Clearance: 56.9 mL/min (by C-G formula based on SCr of 0.73 mg/dL). Liver Function Tests: No results for input(s): AST, ALT, ALKPHOS, BILITOT, PROT, ALBUMIN in the last 168 hours. No results for input(s): LIPASE, AMYLASE in the last 168 hours. No results for input(s): AMMONIA in the last 168 hours. Coagulation profile No results for input(s): INR, PROTIME in the last 168 hours.  CBC: Recent Labs  Lab 03/23/24 0306 03/23/24 1500  WBC 9.7 13.1*  HGB 12.3 12.8  HCT 36.9 39.8  MCV 95.6 99.3  PLT 230 209   Cardiac Enzymes: No results for input(s): CKTOTAL, CKMB, CKMBINDEX, TROPONINI in the last 168 hours. BNP (last 3 results) No results for input(s): PROBNP in the last 8760 hours. CBG: No results for input(s): GLUCAP in the last 168 hours. D-Dimer: No results for input(s): DDIMER in the last 72 hours. Hgb A1c: No results for input(s): HGBA1C in the last 72 hours. Lipid Profile: No results for input(s): CHOL, HDL, LDLCALC, TRIG, CHOLHDL, LDLDIRECT in the last 72 hours. Thyroid  function studies: No results for input(s): TSH, T4TOTAL, T3FREE, THYROIDAB in the last 72 hours.  Invalid input(s): FREET3 Anemia work up: No results for input(s): VITAMINB12, FOLATE, FERRITIN, TIBC, IRON, RETICCTPCT in the last 72  hours. Sepsis Labs: Recent Labs  Lab 03/23/24 0306 03/23/24 1500  WBC 9.7 13.1*    Microbiology Recent Results (from the past 240 hours)  Surgical PCR screen     Status: None   Collection Time: 03/23/24  1:20 AM   Specimen: Nasal Mucosa; Nasal Swab  Result Value Ref Range Status   MRSA, PCR NEGATIVE NEGATIVE Final   Staphylococcus aureus NEGATIVE NEGATIVE Final    Comment: (NOTE) The Xpert SA Assay (FDA approved for NASAL specimens in patients 22 years  of age and older), is one component of a comprehensive surveillance program. It is not intended to diagnose infection nor to guide or monitor treatment. Performed at [facility], [address], [place], [place] [address]     Procedures and diagnostic studies:  DG HIP UNILAT WITH PELVIS 2-3 VIEWS RIGHT Result Date: 03/23/2024 CLINICAL DATA:  Elective surgery. EXAM: DG HIP (WITH OR WITHOUT PELVIS) 2-3V RIGHT COMPARISON:  Radiograph earlier today FINDINGS: Two fluoroscopic spot views of the right hip submitted from the operating room. Three screws traverse right femoral neck fracture. Fluoroscopy time 43.9 seconds. Dose 10.37 mGy per IMPRESSION: Intraoperative fluoroscopy during right femoral neck fracture fixation. Electronically Signed   By: Andrea Gasman M.D.   On: 03/23/2024 16:35   DG Wrist Complete Right Result Date: 03/23/2024 CLINICAL DATA:  Right wrist pain and swelling after fall EXAM: RIGHT WRIST - COMPLETE 3+ VIEW COMPARISON:  None Available. FINDINGS: There is no evidence of fracture or dislocation. Mild narrowing of first carpometacarpal joint is noted. Soft tissues are unremarkable. IMPRESSION: Mild osteoarthritis of first carpometacarpal joint. No acute abnormality seen. Electronically Signed   By: Lynwood Landy Raddle M.D.   On: 03/23/2024 08:54   DG HIP PORT UNILAT WITH PELVIS 1V RIGHT Result Date: 03/23/2024 CLINICAL DATA:  Right femoral neck fracture EXAM: DG HIP (WITH OR WITHOUT PELVIS) 1V PORT RIGHT  COMPARISON:  April 30, 2013 FINDINGS: Mildly displaced and possibly comminuted fracture is seen involving the right femoral neck. IMPRESSION: Mildly displaced and possibly comminuted right femoral neck fracture. Electronically Signed   By: Lynwood Landy Raddle M.D.   On: 03/23/2024 08:52               LOS: 2 days   Eilan Mcinerny  [facility]   Pager on [email or web address]. If 7PM-7AM, please contact night-coverage at [email or web address]     03/24/2024, 3:24 PM           "

## 2024-03-24 NOTE — Evaluation (Signed)
 Physical Therapy Evaluation Patient Details Name: Victoria Gaines MRN: [id number] DOB: [birth year] Today's Date: 03/24/2024  History of Present Illness  78 y.o. female admitted 03/22/24 after mechanical fall sustaining closed displaced right hip fracture. Pt s/p ORIF with  percutaneous screw fixation 12/24. PMHx: COPD, depression, GERD, hypothyroidism, and dyslipidemia.  Clinical Impression  Pt admitted with above diagnosis. PTA, pt was independent for functional mobility, ADLs, and IADLs. She lives with her daughter and granddaughter in a one story house with 1 STE. Pt currently with functional limitations due to the deficits listed below (see PT Problem List). She performed bed mobility with supervision and required CGA for OOB mobility using RW. Pt ambulated a household distance with a step-through antalgic gait pattern. Pt will benefit from acute skilled PT to increase her independence and safety with mobility to allow discharge. Recommend HHPT to increased strength, improve balance, decrease fall risk, and optimize safety within the home environment.      If plan is discharge home, recommend the following: A little help with walking and/or transfers;A little help with bathing/dressing/bathroom;Assistance with cooking/housework;Assist for transportation;Help with stairs or ramp for entrance   Can travel by private vehicle        Equipment Recommendations Rolling walker (2 wheels)  Recommendations for Other Services       Functional Status Assessment Patient has had a recent decline in their functional status and demonstrates the ability to make significant improvements in function in a reasonable and predictable amount of time.     Precautions / Restrictions Precautions Precautions: Fall Recall of Precautions/Restrictions: Intact Precaution/Restrictions Comments: Pt reports hx of vertigo Restrictions Weight Bearing Restrictions Per Provider Order: Yes RLE Weight Bearing Per Provider  Order: Weight bearing as tolerated      Mobility  Bed Mobility Overal bed mobility: Needs Assistance Bed Mobility: Supine to Sit     Supine to sit: Supervision, HOB elevated, Used rails     General bed mobility comments: Pt sat up on R side of bed with increased time. Cues for sequencing. Pt supported RLE by hooking under ankle with left foot.    Transfers Overall transfer level: Needs assistance Equipment used: Rolling walker (2 wheels) Transfers: Sit to/from Stand, Bed to chair/wheelchair/BSC Sit to Stand: Contact guard assist   Step pivot transfers: Contact guard assist       General transfer comment: Pt stood from lowest bed height. Cued proper hand placement using RW. Powered up with CGA. Transferred to restroom and recliner chair.    Ambulation/Gait Ambulation/Gait assistance: Contact guard assist Gait Distance (Feet): 80 Feet Assistive device: Rolling walker (2 wheels) Gait Pattern/deviations: Step-through pattern, Decreased stance time - right, Decreased dorsiflexion - right, Antalgic Gait velocity: decreased Gait velocity interpretation: <1.8 ft/sec, indicate of risk for recurrent falls   General Gait Details: Pt ambulated with a mild antalgic gait pattern. She slighlty limited WBing on RLE d/t pain. Pt maintained upright posture and good proximity to RW. She navigated room, hallway, and bathroom well. No LOB.  Stairs            Wheelchair Mobility     Tilt Bed    Modified Rankin (Stroke Patients Only)       Balance Overall balance assessment: Needs assistance Sitting-balance support: No upper extremity supported, Feet supported Sitting balance-Leahy Scale: Good Sitting balance - Comments: Pt sat EOB with supervision. She performed pericare on toilet.   Standing balance support: Bilateral upper extremity supported, During functional activity, Reliant on assistive device for balance Standing balance-Leahy  Scale: Fair Standing balance comment: Pt  dependent on RW. She washed her hands and brushed her teeth standing statically at the sink.                             Pertinent Vitals/Pain Pain Assessment Pain Assessment: 0-10 Pain Score: 4  Pain Location: R Hip with movement Pain Descriptors / Indicators: Discomfort, Aching Pain Intervention(s): Monitored during session, Limited activity within patient's tolerance, Repositioned, Patient requesting pain meds-RN notified    Home Living Family/patient expects to be discharged to:: Private residence Living Arrangements: Children;Other relatives (Daughter, and Granddaughter) Available Help at Discharge: Family;Available PRN/intermittently (Daughter works, Surveyor, Quantity (19) works, Boyfriend retired) Type of Home: [employer] Access: Stairs to enter Entrance Stairs-Rails: None Secretary/administrator of Steps: 1   Home Layout: One level Home Equipment: None      Prior Function Prior Level of Function : Independent/Modified Independent;Driving             Mobility Comments: Indep, no AD. 1 fall leading to admit, no other falls. ADLs Comments: Indep with ADLs. Manages her own medications. Drives. Loves to dance, goes a couple times a week.     Extremity/Trunk Assessment   Upper Extremity Assessment Upper Extremity Assessment: Overall WFL for tasks assessed;Right hand dominant    Lower Extremity Assessment Lower Extremity Assessment: RLE deficits/detail RLE Deficits / Details: Pt POD 1 s/p femur ORIF. Decreased hip AROM/strength. PROM WFL, slightly guarded. Grossly 3/5 strength. Knee and Ankle WFL. RLE: Unable to fully assess due to pain RLE Sensation: WNL RLE Coordination: WNL    Cervical / Trunk Assessment Cervical / Trunk Assessment: Normal  Communication   Communication Communication: No apparent difficulties    Cognition Arousal: Alert Behavior During Therapy: WFL for tasks assessed/performed   PT - Cognitive impairments: No apparent  impairments                       PT - Cognition Comments: Pt A,Ox4 Following commands: Intact       Cueing Cueing Techniques: Verbal cues, Gestural cues     General Comments General comments (skin integrity, edema, etc.): VSS on RA    Exercises General Exercises - Lower Extremity Ankle Circles/Pumps: Supine, Both, AROM, 10 reps Long Arc Quad: Seated, Both, AROM, 10 reps Heel Slides: Supine, Both, AROM, 10 reps Other Exercises Other Exercises: Educated pt on RLE HEP using surgical hip handout. Instructed pt to complete 10 reps of each exercise 2-3x/day.   Assessment/Plan    PT Assessment Patient needs continued PT services  PT Problem List Decreased strength;Decreased range of motion;Decreased activity tolerance;Decreased balance;Decreased mobility;Decreased knowledge of use of DME;Decreased safety awareness;Pain       PT Treatment Interventions DME instruction;Gait training;Stair training;Functional mobility training;Therapeutic activities;Therapeutic exercise;Balance training;Patient/family education    PT Goals (Current goals can be found in the Care Plan section)  Acute Rehab PT Goals Patient Stated Goal: Return Home PT Goal Formulation: With patient Time For Goal Achievement: 04/07/24 Potential to Achieve Goals: Good    Frequency Min 2X/week     Co-evaluation               AM-PAC PT 6 Clicks Mobility  Outcome Measure Help needed turning from your back to your side while in a flat bed without using bedrails?: A Little Help needed moving from lying on your back to sitting on the side of a flat bed without using bedrails?: A Little  Help needed moving to and from a bed to a chair (including a wheelchair)?: A Little Help needed standing up from a chair using your arms (e.g., wheelchair or bedside chair)?: A Little Help needed to walk in hospital room?: A Little Help needed climbing 3-5 steps with a railing? : A Lot 6 Click Score: 17    End of  Session Equipment Utilized During Treatment: Gait belt Activity Tolerance: Patient tolerated treatment well Patient left: in chair;with call bell/phone within reach;with chair alarm set Nurse Communication: Mobility status;Patient requests pain meds PT Visit Diagnosis: Difficulty in walking, not elsewhere classified (R26.2);Other abnormalities of gait and mobility (R26.89);Unsteadiness on feet (R26.81)    Time: 9167-9097 PT Time Calculation (min) (ACUTE ONLY): 30 min   Charges:   PT Evaluation $PT Eval Moderate Complexity: 1 Mod PT Treatments $Therapeutic Activity: 8-22 mins PT General Charges $$ ACUTE PT VISIT: 1 Visit         Randall SAUNDERS, PT, DPT Acute Rehabilitation Services Office: [phone] Secure Chat Preferred  Delon CHRISTELLA Callander 03/24/2024, 11:25 AM

## 2024-03-24 NOTE — Discharge Instructions (Signed)
 Orthopaedic Discharge Instructions   General Discharge Instructions  WEIGHT BEARING STATUS: As tolerated  RANGE OF MOTION/ACTIVITY: No restrictions  Wound Care: You may remove your surgical dressing on 03/27/2024 Incisions can be left open to air if there is no drainage. Once the incision is completely dry and without drainage, it may be left open to air out.  Showering may begin 03/27/24.  Clean incision gently with soap and water.  DVT/PE prophylaxis: Aspirin   Diet: as you were eating previously.  Can use over the counter stool softeners and bowel preparations, such as Miralax , to help with bowel movements.  Narcotics can be constipating.  Be sure to drink plenty of fluids  PAIN MEDICATION USE AND EXPECTATIONS  You have likely been given narcotic medications to help control your pain.  After a traumatic event that results in an fracture (broken bone) with or without surgery, it is ok to use narcotic pain medications to help control one's pain.  We understand that everyone responds to pain differently and each individual patient will be evaluated on a regular basis for the continued need for narcotic medications. Ideally, narcotic medication use should last no more than 6-8 weeks (coinciding with fracture healing).   As a patient it is your responsibility as well to monitor narcotic medication use and report the amount and frequency you use these medications when you come to your office visit.   We would also advise that if you are using narcotic medications, you should take a dose prior to therapy to maximize you participation.  IF YOU ARE ON NARCOTIC MEDICATIONS IT IS NOT PERMISSIBLE TO OPERATE A MOTOR VEHICLE (MOTORCYCLE/CAR/TRUCK/MOPED) OR HEAVY MACHINERY DO NOT MIX NARCOTICS WITH OTHER CNS (CENTRAL NERVOUS SYSTEM) DEPRESSANTS SUCH AS ALCOHOL   POST-OPERATIVE OPIOID TAPER INSTRUCTIONS: It is important to wean off of your opioid medication as soon as possible. If you do not need pain  medication after your surgery it is ok to stop day one. Opioids include: Codeine, Hydrocodone (Norco, Vicodin), Oxycodone (Percocet, oxycontin ) and hydromorphone  amongst others.  Long term and even short term use of opiods can cause: Increased pain response Dependence Constipation Depression Respiratory depression And more.  Withdrawal symptoms can include Flu like symptoms Nausea, vomiting And more Techniques to manage these symptoms Hydrate well Eat regular healthy meals Stay active Use relaxation techniques(deep breathing, meditating, yoga) Do Not substitute Alcohol  to help with tapering If you have been on opioids for less than two weeks and do not have pain than it is ok to stop all together.  Plan to wean off of opioids This plan should start within one week post op of your fracture surgery  Maintain the same interval or time between taking each dose and first decrease the dose.  Cut the total daily intake of opioids by one tablet each day Next start to increase the time between doses. The last dose that should be eliminated is the evening dose.    STOP SMOKING OR USING NICOTINE  PRODUCTS!!!!  As discussed nicotine  severely impairs your body's ability to heal surgical and traumatic wounds but also impairs bone healing.  Wounds and bone heal by forming microscopic blood vessels (angiogenesis) and nicotine  is a vasoconstrictor (essentially, shrinks blood vessels).  Therefore, if vasoconstriction occurs to these microscopic blood vessels they essentially disappear and are unable to deliver necessary nutrients to the healing tissue.  This is one modifiable factor that you can do to dramatically increase your chances of healing your injury.  (This means no smoking, no nicotine  gum, patches,  etc)  DO NOT USE NONSTEROIDAL ANTI-INFLAMMATORY DRUGS (NSAID'S)  Using products such as Advil (ibuprofen), Aleve (naproxen), Motrin (ibuprofen) for additional pain control during fracture healing  can delay and/or prevent the healing response.  If you would like to take over the counter (OTC) medication, Tylenol  (acetaminophen ) is ok.  However, some narcotic medications that are given for pain control contain acetaminophen  as well. Therefore, you should not exceed more than 4000 mg of tylenol  in a day if you do not have liver disease.  Also note that there are may OTC medicines, such as cold medicines and allergy medicines that my contain tylenol  as well.  If you have any questions about medications and/or interactions please ask your doctor/PA or your pharmacist.      ICE AND ELEVATE INJURED/OPERATIVE EXTREMITY  Using ice and elevating the injured extremity above your heart can help with swelling and pain control.  Icing in a pulsatile fashion, such as 20 minutes on and 20 minutes off, can be followed.    Do not place ice directly on skin. Make sure there is a barrier between to skin and the ice pack.    Using frozen items such as frozen peas works well as the conform nicely to the are that needs to be iced.    CALL THE OFFICE FOR MEDICATION REFILLS OR WITH ANY QUESTIONS/CONCERNS: [phone]        Discharge Wound Care Instructions  Do NOT apply any ointments, solutions or lotions to pin sites or surgical wounds.  These prevent needed drainage and even though solutions like hydrogen peroxide kill bacteria, they also damage cells lining the pin sites that help fight infection.  Applying lotions or ointments can keep the wounds moist and can cause them to breakdown and open up as well. This can increase the risk for infection. When in doubt call the office.  Surgical incisions should be dressed daily.  If any drainage is noted, use one layer of adaptic or Mepitel, then gauze, Kerlix, and an ace wrap. - These dressing supplies should be available at local medical supply stores ([facility], [facility], etc) as well as insurance claims handler ([facility], [facility], [place], etc)  Once the  incision is completely dry and without drainage, it may be left open to air out.  Showering may begin 36-48 hours later.  Cleaning gently with soap and water.  Traumatic wounds should be dressed daily as well.    One layer of adaptic, gauze, Kerlix, then ace wrap.  The adaptic can be discontinued once the draining has ceased    If you have a wet to dry dressing: wet the gauze with saline the squeeze as much saline out so the gauze is moist (not soaking wet), place moistened gauze over wound, then place a dry gauze over the moist one, followed by Kerlix wrap, then ace wrap.    Call office for the following: Temperature greater than 101F Persistent nausea and vomiting Severe uncontrolled pain Redness, tenderness, or signs of infection (pain, swelling, redness, odor or green/yellow discharge around the site) Difficulty breathing, headache or visual disturbances Hives Persistent dizziness or light-headedness Extreme fatigue Any other questions or concerns you may have after discharge  In an emergency, call 911 or go to an Emergency Department at a nearby hospital  OTHER HELPFUL INFORMATION  If you had a block, it will wear off between 8-24 hrs postop typically.  This is period when your pain may go from nearly zero to the pain you would have had  postop without the block.  This is an abrupt transition but nothing dangerous is happening.  You may take an extra dose of narcotic when this happens.  You should wean off your narcotic medicines as soon as you are able.  Most patients will be off or using minimal narcotics before their first postop appointment.   We suggest you use the pain medication the first night prior to going to bed, in order to ease any pain when the anesthesia wears off. You should avoid taking pain medications on an empty stomach as it will make you nauseous.  Do not drink alcoholic beverages or take illicit drugs when taking pain medications.  In most states it is  against the law to drive while you are in a splint or sling.  And certainly against the law to drive while taking narcotics.  You may return to work/school in the next couple of days when you feel up to it.   Pain medication may make you constipated.  Below are a few solutions to try in this order: Decrease the amount of pain medication if you arent having pain. Drink lots of decaffeinated fluids. Drink prune juice and/or each dried prunes  If the first 3 dont work start with additional solutions Take Colace - an over-the-counter stool softener Take Senokot - an over-the-counter laxative Take Miralax  - a stronger over-the-counter laxative   Follow up with Dr Gaines in 2 weeks  Victoria Reyne, MD, MS [facility] Orthopedics Specialist / [place] [phone]

## 2024-03-25 ENCOUNTER — Encounter (HOSPITAL_COMMUNITY): Payer: Self-pay

## 2024-03-25 DIAGNOSIS — S72001A Fracture of unspecified part of neck of right femur, initial encounter for closed fracture: Secondary | ICD-10-CM | POA: Diagnosis not present

## 2024-03-25 LAB — CBC
HCT: 38 % (ref 36.0–46.0)
Hemoglobin: 12.3 g/dL (ref 12.0–15.0)
MCH: 31.9 pg (ref 26.0–34.0)
MCHC: 32.4 g/dL (ref 30.0–36.0)
MCV: 98.4 fL (ref 80.0–100.0)
Platelets: 210 K/uL (ref 150–400)
RBC: 3.86 MIL/uL — ABNORMAL LOW (ref 3.87–5.11)
RDW: 14 % (ref 11.5–15.5)
WBC: 10.3 K/uL (ref 4.0–10.5)
nRBC: 0 % (ref 0.0–0.2)

## 2024-03-25 NOTE — Progress Notes (Addendum)
 DISCHARGE NOTE HOME Victoria Gaines to be discharged Home per MD order. Discussed prescriptions and follow up appointments with the patient. Prescriptions given to patient; medication list explained in detail. Patient verbalized understanding.  DME to be delivered to the home Saturday.  Skin clean, dry and intact without evidence of skin break down, no evidence of skin tears noted. IV catheter discontinued intact. Site without signs and symptoms of complications. Dressing and pressure applied. Pt denies pain at the site currently. No complaints noted.  See LDA for surgical incision right hip Patient free of lines, drains, and wounds.   An After Visit Summary (AVS) was printed and given to the patient. Patient escorted via wheelchair, and discharged home via private auto.  Peyton SHAUNNA Pepper, RN

## 2024-03-25 NOTE — TOC Transition Note (Addendum)
 Transition of Care [facility]) - Discharge Note   Patient Details  Name: Victoria Gaines MRN: [id number] Date of Birth: 22-Jan-1946  Transition of Care [facility]) CM/SW Contact:  Rosalva Jon Bloch, RN Phone Number: 03/25/2024, 2:54 PM   Clinical Narrative:    Patient will DC to: home Anticipated DC date: 03/25/2024 Family notified: yes Transport by: car     R hip fracture,  s/p ORIF 12/24 /2025  Per MD patient ready for DC today . RN, patient, and  patient's family notified of DC. Home health and DME needs noted, pt agreeable. Pt without provider preference. Referral made via HUB.  NCM called [facility]/ [facility] [phone]) for DME: RW and BSC. Equipment will be delivered to pt once approval received. [facility] accepted for home health services, [facility] 12/28. Pt without RX med concerns. Post hospital f/u noted on AVS.  Family to provide transportation to home.  RNCM will sign off for now as intervention is no longer needed. Please consult us  again if new needs arise.    Final next level of care: Home w Home Health Services Barriers to Discharge: No Barriers Identified   Patient Goals and CMS Choice     Choice offered to / list presented to : Patient      Discharge Placement                       Discharge Plan and Services Additional resources added to the After Visit Summary for                  DME Arranged: Bedside commode, Walker rolling DME Agency: [facility] Date DME Agency Contacted: 03/25/24   Representative spoke with at DME Agency: [facility] Arranged: PT, OT [facility] Agency: [facility] Date HH Agency Contacted: 03/25/24 Time HH Agency Contacted: 1453 Representative spoke with at [facility] Agency: [facility]  Social Drivers of Health (SDOH) Interventions SDOH Screenings   Food Insecurity: No Food Insecurity (03/22/2024)  Housing: Low Risk (03/22/2024)  Transportation Needs: No Transportation Needs (03/22/2024)  Utilities: Not  At Risk (03/22/2024)  Social Connections: Unknown (03/22/2024)  Tobacco Use: High Risk (03/23/2024)     Readmission Risk Interventions     No data to display

## 2024-03-25 NOTE — Plan of Care (Signed)
  Problem: Education: Goal: Knowledge of General Education information will improve Description: Including pain rating scale, medication(s)/side effects and non-pharmacologic comfort measures Outcome: Progressing   Problem: Clinical Measurements: Goal: Ability to maintain clinical measurements within normal limits will improve Outcome: Progressing   Problem: Activity: Goal: Risk for activity intolerance will decrease Outcome: Progressing   Problem: Nutrition: Goal: Adequate nutrition will be maintained Outcome: Progressing   Problem: Elimination: Goal: Will not experience complications related to bowel motility Outcome: Progressing Goal: Will not experience complications related to urinary retention Outcome: Progressing   Problem: Pain Managment: Goal: General experience of comfort will improve and/or be controlled Outcome: Progressing   Problem: Safety: Goal: Ability to remain free from injury will improve Outcome: Progressing   Problem: Skin Integrity: Goal: Risk for impaired skin integrity will decrease Outcome: Progressing

## 2024-03-25 NOTE — Care Management Important Message (Signed)
 Important Message  Patient Details  Name: Victoria Gaines MRN: [id number] Date of Birth: [birth year]   Important Message Given:  Yes - Medicare IM     [name] 03/25/2024, 3:45 PM

## 2024-03-25 NOTE — Progress Notes (Addendum)
" °  °  Durable Medical Equipment  (From admission, onward)           Start     Ordered   03/25/24 1438  For home use only DME Bedside commode  Once       Comments: Confine to one room  Question:  Patient needs a bedside commode to treat with the following condition  Answer:  Gait instability   03/25/24 1439   03/25/24 1437  For home use only DME Walker rolling  Once       Question Answer Comment  Walker: With 5 Inch Wheels   Patient needs a walker to treat with the following condition Gait instability      03/25/24 1439            "

## 2024-03-25 NOTE — Progress Notes (Signed)
 Patient has been discharged to home. Discharge completed by Allyson, discharge RN. Please see nurse's note for detail.

## 2024-03-25 NOTE — Evaluation (Signed)
 Occupational Therapy Evaluation Patient Details Name: Victoria Gaines MRN: [id number] DOB: [birth year] Today's Date: 03/25/2024   History of Present Illness   78 y.o. female admitted 03/22/24 after mechanical fall sustaining closed displaced right hip fracture. Pt s/p ORIF with  percutaneous screw fixation 12/24. PMHx: COPD, depression, GERD, hypothyroidism, and dyslipidemia.     Clinical Impressions Victoria Gaines was evaluated s/p the above admission list. She is indep at baseline. Upon evaluation the pt was limited by RLE pain, balance and decreased activity tolerance. Overall she needed generalized superivsion A for mobility with Rw. Due to the deficits listed below the pt also needs up to min A for LB ADLs as she was unable ot reach her R foot. Pt will benefit from continued acute OT services and no follow up OT needed.       If plan is discharge home, recommend the following:   A little help with walking and/or transfers;A little help with bathing/dressing/bathroom;Assistance with cooking/housework;Assist for transportation;Help with stairs or ramp for entrance     Functional Status Assessment   Patient has had a recent decline in their functional status and demonstrates the ability to make significant improvements in function in a reasonable and predictable amount of time.     Equipment Recommendations   BSC/3in1;None recommended by OT (RW)      Precautions/Restrictions   Precautions Precautions: Fall Recall of Precautions/Restrictions: Intact Precaution/Restrictions Comments: Pt reports hx of vertigo Restrictions Weight Bearing Restrictions Per Provider Order: Yes RLE Weight Bearing Per Provider Order: Weight bearing as tolerated     Mobility Bed Mobility Overal bed mobility: Needs Assistance Bed Mobility: Supine to Sit     Supine to sit: Supervision     General bed mobility comments: hook method    Transfers Overall transfer level: Needs assistance Equipment  used: Rolling walker (2 wheels) Transfers: Sit to/from Stand, Bed to chair/wheelchair/BSC Sit to Stand: Supervision           General transfer comment: pt states the more she moves and walks the better she feels      Balance Overall balance assessment: Needs assistance Sitting-balance support: No upper extremity supported, Feet supported Sitting balance-Victoria Gaines Scale: Good     Standing balance support: Single extremity supported, During functional activity Standing balance-Victoria Gaines Scale: Fair Standing balance comment: statically           ADL either performed or assessed with clinical judgement   ADL Overall ADL's : Needs assistance/impaired Eating/Feeding: Independent   Grooming: Supervision/safety;Standing   Upper Body Bathing: Set up;Sitting   Lower Body Bathing: Minimal assistance Lower Body Bathing Details (indicate cue type and reason): min A for R foot Upper Body Dressing : Set up;Sitting   Lower Body Dressing: Minimal assistance Lower Body Dressing Details (indicate cue type and reason): pt is unable to reach R foot Toilet Transfer: Supervision/safety;Ambulation;Rolling walker (2 wheels)   Toileting- Clothing Manipulation and Hygiene: Modified independent;Sitting/lateral lean       Functional mobility during ADLs: Supervision/safety;Rolling walker (2 wheels) General ADL Comments: pt states her dtr or granddtr can assist with LB ADLs as needed     Vision Baseline Vision/History: 1 Wears glasses Vision Assessment?: No apparent visual deficits     Perception Perception: Within Functional Limits       Praxis Praxis: WFL       Pertinent Vitals/Pain Pain Assessment Pain Assessment: Faces Faces Pain Scale: Hurts little more Pain Location: R Hip with movement Pain Descriptors / Indicators: Discomfort, Aching Pain Intervention(s): Limited activity within patient's  tolerance, Monitored during session     Extremity/Trunk Assessment Upper Extremity  Assessment Upper Extremity Assessment: Overall WFL for tasks assessed   Lower Extremity Assessment Lower Extremity Assessment: Defer to PT evaluation   Cervical / Trunk Assessment Cervical / Trunk Assessment: Normal   Communication Communication Communication: No apparent difficulties   Cognition Arousal: Alert Behavior During Therapy: WFL for tasks assessed/performed Cognition: No apparent impairments               Following commands: Intact       Cueing  General Comments   Cueing Techniques: Verbal cues;Gestural cues  VSS           Home Living Family/patient expects to be discharged to:: Private residence Living Arrangements: Children;Other relatives Available Help at Discharge: Family;Available PRN/intermittently Type of Home: House Home Access: Stairs to enter [employer] of Steps: 1 Entrance Stairs-Rails: None Home Layout: One level     Bathroom Shower/Tub: Chief Strategy Officer: Standard Bathroom Accessibility: No   Home Equipment: None          Prior Functioning/Environment Prior Level of Function : Independent/Modified Independent;Driving             Mobility Comments: Indep, no AD. 1 fall leading to admit, no other falls. ADLs Comments: Indep with ADLs. Manages her own medications. Drives. Loves to dance, goes a couple times a week.    OT Problem List: Decreased strength;Decreased range of motion;Impaired balance (sitting and/or standing);Decreased activity tolerance   OT Treatment/Interventions: [place]inkle-care/ADL training;Therapeutic exercise;DME and/or AE instruction;Therapeutic activities;Patient/family education;Balance training      OT Goals(Current goals can be found in the care plan section)   Acute Rehab OT Goals Patient Stated Goal: home OT Goal Formulation: With patient Time For Goal Achievement: 03/25/24 Potential to Achieve Goals: Good ADL Goals Additional ADL Goal #1: Pt will complete all LB  ADLs with mod I   OT Frequency:  Min 2X/week       AM-PAC OT 6 Clicks Daily Activity     Outcome Measure Help from another person eating meals?: None Help from another person taking care of personal grooming?: A Little Help from another person toileting, which includes using toliet, bedpan, or urinal?: A Little Help from another person bathing (including washing, rinsing, drying)?: A Little Help from another person to put on and taking off regular upper body clothing?: A Little Help from another person to put on and taking off regular lower body clothing?: A Little 6 Click Score: 19   End of Session Equipment Utilized During Treatment: Gait belt;Rolling walker (2 wheels) Nurse Communication: Mobility status  Activity Tolerance: Patient tolerated treatment well Patient left: in chair;with call bell/phone within reach;with chair alarm set  OT Visit Diagnosis: Unsteadiness on feet (R26.81);Other abnormalities of gait and mobility (R26.89);Muscle weakness (generalized) (M62.81);Pain                Time: 9057-8997 OT Time Calculation (min): 20 min Charges:  OT General Charges $OT Visit: 1 Visit OT Evaluation $OT Eval Moderate Complexity: 1 Mod  Lucie Kendall, OTR/L Acute Rehabilitation Services Office [phone] Secure Chat Communication Preferred   Lucie JONETTA Kendall 03/25/2024, 12:08 PM

## 2024-03-25 NOTE — Progress Notes (Signed)
 SATURATION QUALIFICATIONS: (This note is used to comply with regulatory documentation for home oxygen)  Patient Saturations on Room Air at Rest = 98%  Patient Saturations on Room Air while Ambulating = 93% and above   Please briefly explain why patient needs home oxygen: Do not anticipate pt will need supplemental O2 for ambulation at this time.

## 2024-03-25 NOTE — Progress Notes (Signed)
 Orthopaedic Progress Note  S: Patient is resting comfortably bed.  She denies any significant pain.  O:  Vitals:   03/25/24 0408 03/25/24 0754  BP: 98/60 (!) 105/52  Pulse: 68 72  Resp: 18 16  Temp: 98.2 F (36.8 C) 98 F (36.7 C)  SpO2: (!) 86% 96%    Clean dressing on the right hip.  Minimal bruising.  Thigh soft.  No calf tenderness.  No heel tenderness.  Intact sensation in the saphenous, sural, tibial, peroneal nerve distributions.  5/5 strength EHL, FHL, gastrocs, tibialis anterior   Labs: No results found for this or any previous visit (from the past 24 hours).  Assessment: Postop day 2 status post percutaneous pinning right hip  The patient is doing well after surgery.  She can continue to be mobilized with physical therapy.  She is weightbearing as tolerated.  Continue the current pain regimen.  Continue the aspirin  for DVT prophylaxis.  Continue the current bowel regimen.  She can follow-up with me in 2 weeks time.  From orthopedic standpoint, should be discharged to home once stable from medicine and cleared therapy  Injuries: Right femoral neck fracture  Weightbearing: Weightbearing as tolerated  Insicional and dressing care: As needed   Pain management: Continue current pain regimen  VTE prophylaxis: Aspirin    Dispo: Home when cleared from medicine and therapy  Follow - up plan: 2 weeks   Cordella Rhein, MD, MS [facility] Orthopedics Specialist / [place] [phone]

## 2024-03-25 NOTE — Progress Notes (Signed)
 Physical Therapy Treatment Patient Details Name: Victoria Gaines MRN: [id number] DOB: 1945/08/15 Today's Date: 03/25/2024   History of Present Illness 78 y.o. female admitted 03/22/24 after mechanical fall sustaining closed displaced right hip fracture. Pt s/p ORIF with  percutaneous screw fixation 12/24. PMHx: COPD, depression, GERD, hypothyroidism, and dyslipidemia.    PT Comments  Pt received in supine, agreeable to therapy session and with good participation and tolerance for transfer, gait and stair negotiation. Pt needing up to Supervision for transfers and gait and up to CGA for safety with stairs with BUE support. PTA also verbally discussed car transfer safety and pt reports car is not high up so does not need to practice car transfer with running board. SpO2 93% and above on RA and HR WFL with good pleth signal when checked on earlobe ambulating, pt had poor signal on fingertips due to cold temp and nailpolish on hands. Pt continues to benefit from PT services to progress toward functional mobility goals, continue to recommend HHPT upon DC.    If plan is discharge home, recommend the following: A little help with walking and/or transfers;A little help with bathing/dressing/bathroom;Assistance with cooking/housework;Assist for transportation;Help with stairs or ramp for entrance   Can travel by private vehicle        Equipment Recommendations  Rolling walker (2 wheels) (OT note also recommending 3in1 BSC, pt aware she may have to pay out of pocket if she decides she wants a tub shower bench)    Recommendations for Other Services       Precautions / Restrictions Precautions Precautions: Fall Recall of Precautions/Restrictions: Intact Precaution/Restrictions Comments: Pt reports hx of vertigo, no c/o dizziness during PT session 12/26 Restrictions Weight Bearing Restrictions Per Provider Order: Yes RLE Weight Bearing Per Provider Order: Weight bearing as tolerated     Mobility   Bed Mobility Overal bed mobility: Needs Assistance Bed Mobility: Supine to Sit, Sit to Supine     Supine to sit: Modified independent (Device/Increase time) Sit to supine: Modified independent (Device/Increase time)   General bed mobility comments: hook method for BLE    Transfers Overall transfer level: Needs assistance Equipment used: Rolling walker (2 wheels) Transfers: Sit to/from Stand, Bed to chair/wheelchair/BSC Sit to Stand: Supervision           General transfer comment: Cues for safe UE placement and keeping sore leg advanced slightly for less pain    Ambulation/Gait Ambulation/Gait assistance: Supervision Gait Distance (Feet): 100 Feet (150ft, seated break, 33ft) Assistive device: Rolling walker (2 wheels) Gait Pattern/deviations: Step-through pattern, Decreased stance time - right, Decreased dorsiflexion - right, Antalgic, Step-to pattern       General Gait Details: Pt ambulated with a mild antalgic gait pattern. She slightly limited WBing on RLE d/t pain. Pt maintained upright posture and good proximity to RW. She navigated room and hallway well, PTA lowered RW 1 click prior to entering hallway as pt tending to hunch shoulders up, improved UE posture after adjustment, PTA instructed pt on fitting DME properly. SpO2 checked on ear as finger sensor reading 41% on RA ambulating on RA with noisy signal (nailpolish) and with ear sensor and good pleth, SpO2 93% and above on RA with exertion.   Stairs Stairs: Yes Stairs assistance: Contact guard assist Stair Management: Two rails, Step to pattern, Forwards, With walker Number of Stairs: 3 General stair comments: single 6 step in room x3 reps, 2 reps with RW over step but pt reports only has a pole near the door and  no rails. with RW further ahead to simulate at top of 2 stairs, pt able to perform still with CGA for safety.   Wheelchair Mobility     Tilt Bed    Modified Rankin (Stroke Patients Only)        Balance Overall balance assessment: Needs assistance Sitting-balance support: No upper extremity supported, Feet supported Sitting balance-Leahy Scale: Good     Standing balance support: Single extremity supported, During functional activity, Bilateral upper extremity supported Standing balance-Leahy Scale: Fair Standing balance comment: statically, poor with UE unsupported, needs RW                            Communication Communication Communication: No apparent difficulties  Cognition Arousal: Alert Behavior During Therapy: WFL for tasks assessed/performed   PT - Cognitive impairments: No apparent impairments                       PT - Cognition Comments: Pt A&O but initially doesn't recall working with OT 2 hours prior, when PTA discussed what they worked on during session, pt then recalls OT session. Otherwise appears WFL. Following commands: Intact      Cueing Cueing Techniques: Verbal cues, Gestural cues  Exercises Other Exercises Other Exercises: pt previously instructed and has handout per chart review, emphasis this session on stairs/gait and amb sats    General Comments General comments (skin integrity, edema, etc.): SpO2 WFL on RA, checked ambulatory sats and HR/SpO2 >92% throughout      Pertinent Vitals/Pain Pain Assessment Pain Assessment: 0-10 Faces Pain Scale: Hurts little more Pain Location: R Hip with movement/gait Pain Descriptors / Indicators: Discomfort, Aching, Operative site guarding, Grimacing Pain Intervention(s): Limited activity within patient's tolerance, Monitored during session, Premedicated before session, Repositioned, Ice applied    Home Living Family/patient expects to be discharged to:: Private residence Living Arrangements: Children;Other relatives Available Help at Discharge: Family;Available PRN/intermittently Type of Home: House Home Access: Stairs to enter Entrance Stairs-Rails: None Entrance Stairs-Number of  Steps: 1   Home Layout: One level Home Equipment: None      Prior Function            PT Goals (current goals can now be found in the care plan section) Acute Rehab PT Goals Patient Stated Goal: Return Home PT Goal Formulation: With patient Time For Goal Achievement: 04/07/24 Progress towards PT goals: Progressing toward goals    Frequency    Min 2X/week      PT Plan      Co-evaluation              AM-PAC PT 6 Clicks Mobility   Outcome Measure  Help needed turning from your back to your side while in a flat bed without using bedrails?: None Help needed moving from lying on your back to sitting on the side of a flat bed without using bedrails?: None Help needed moving to and from a bed to a chair (including a wheelchair)?: A Little Help needed standing up from a chair using your arms (e.g., wheelchair or bedside chair)?: A Little Help needed to walk in hospital room?: A Little Help needed climbing 3-5 steps with a railing? : A Little 6 Click Score: 20    End of Session Equipment Utilized During Treatment: Gait belt Activity Tolerance: Patient tolerated treatment well Patient left: in bed;with call bell/phone within reach;with bed alarm set;Other (comment) (RLE elevated, ice to R hip; pt aware  to remove ice after 20 mins or if too cold; recently returned to bed from chair; has purewick in place per patient request) Nurse Communication: Mobility status PT Visit Diagnosis: Difficulty in walking, not elsewhere classified (R26.2);Other abnormalities of gait and mobility (R26.89);Unsteadiness on feet (R26.81)     Time: 8778-8744 PT Time Calculation (min) (ACUTE ONLY): 34 min  Charges:    $Gait Training: 23-37 mins PT General Charges $$ ACUTE PT VISIT: 1 Visit                     Ellicia Alix P., PTA Acute Rehabilitation Services Secure Chat Preferred 9a-5:30pm Office: [phone]    Connell HERO [facility] 03/25/2024, 1:11 PM

## 2024-03-25 NOTE — Discharge Summary (Signed)
 " Physician Discharge Summary   Patient: Victoria Gaines MRN: [id number] DOB: [birth year]  Admit date:     03/22/2024  Discharge date: 03/25/2024  Discharge Physician: AIDA CHO   PCP: Hairfield, Keavie C   Recommendations at discharge:    Follow up with Dr. Reyne, orthopedic surgeon, in 2 weeks  Discharge Diagnoses: Principal Problem:   Closed right hip fracture, initial encounter [facility]) Active Problems:   Hypothyroidism   Dyslipidemia   Chronic obstructive pulmonary disease (COPD) (HCC)   Right wrist pain   Depression  Resolved Problems:   * No resolved hospital problems. *  Hospital Course:  Victoria Gaines is a 78 y.o. female with medical history significant for COPD, hypothyroidism, dyslipidemia, depression, who presented to the hospital because of pain in the right hip after a fall.  She said she lost her balance while turning and walking towards her husband's car.  She fell on her right side when she fell.  She developed severe right hip pain after the fall.   Workup in the ED revealed right femoral neck fracture.    Assessment and Plan:  Right femoral neck fracture: S/p percutaneous pinning of right hip 03/24/2023 Analgesia as needed for pain.   Orthopedic surgery recommended aspirin  for DVT prophylaxis at discharge. PT recommended home health therapy and DME at discharge. Follow-up with orthopedic surgeon in 2 weeks.     Right wrist pain: Analgesics as needed for pain.  X-ray did not show any evidence of acute fracture but there is mild osteoarthritis of the first carpometacarpal joint.     COPD: Stable.  Continue bronchodilators.     Comorbidities include dyslipidemia, hypothyroidism, depression   Her condition has improved and she is deemed stable for discharge to home. She was able to ambulate on room air without oxygen desaturation.        Consultants: Orthopedic surgeon Procedures performed: S/p percutaneous pinning of right hip 03/23/2024    Disposition: Home health Diet recommendation:  Cardiac diet DISCHARGE MEDICATION: Allergies as of 03/25/2024       Reactions   Macrodantin [nitrofurantoin Macrocrystal] Rash        Medication List     STOP taking these medications    ibuprofen 200 MG tablet Commonly known as: ADVIL       TAKE these medications    albuterol  108 (90 Base) MCG/ACT inhaler Commonly known as: VENTOLIN  HFA Inhale 2 puffs into the lungs every 6 (six) hours as needed for wheezing or shortness of breath.   Anoro Ellipta  62.5-25 MCG/ACT Aepb Generic drug: umeclidinium-vilanterol Inhale 1 puff into the lungs daily.   aspirin  EC 325 MG tablet Take 1 tablet (325 mg total) by mouth daily.   docusate sodium  100 MG capsule Commonly known as: COLACE Take 1 capsule (100 mg total) by mouth 2 (two) times daily.   HYDROcodone -acetaminophen  5-325 MG tablet Commonly known as: NORCO/VICODIN Take 1-2 tablets by mouth every 6 (six) hours as needed for moderate pain (pain score 4-6).   levothyroxine  125 MCG tablet Commonly known as: SYNTHROID  Take 125 mcg by mouth daily.   rosuvastatin  5 MG tablet Commonly known as: CRESTOR  Take 5 mg by mouth daily.   trolamine salicylate 10 % cream Commonly known as: ASPERCREME Apply 1 Application topically as needed for muscle pain.   Venlafaxine  HCl 225 MG Tb24 Take 1 tablet by mouth daily.               Discharge Care Instructions  (From admission, onward)  Start     Ordered   03/25/24 0000  Discharge wound care:       Comments: Follow-up with orthopedic surgeon for wound check   03/25/24 1216            Follow-up Information     Reyne Cordella SQUIBB, MD. Schedule an appointment as soon as possible for a visit in 2 week(s).   Specialty: Orthopedic Surgery Contact information: [address] [place] [address] [phone]                Discharge Exam: Fredricka Weights   03/22/24 2128 03/23/24 1209  Weight:  69.1 kg 69.1 kg   GEN: NAD SKIN: Warm and dry EYES: No pallor or icterus ENT: MMM CV: RRR PULM: CTA B ABD: soft, ND, NT, +BS CNS: AAO x 3, non focal EXT: Mild swelling and tenderness of the right hip.  Dressing on right hip surgical wound is clean, dry and intact.   Condition at discharge: good  The results of significant diagnostics from this hospitalization (including imaging, microbiology, ancillary and laboratory) are listed below for reference.   Imaging Studies: DG HIP UNILAT WITH PELVIS 2-3 VIEWS RIGHT Result Date: 03/23/2024 CLINICAL DATA:  Elective surgery. EXAM: DG HIP (WITH OR WITHOUT PELVIS) 2-3V RIGHT COMPARISON:  Radiograph earlier today FINDINGS: Two fluoroscopic spot views of the right hip submitted from the operating room. Three screws traverse right femoral neck fracture. Fluoroscopy time 43.9 seconds. Dose 10.37 mGy per IMPRESSION: Intraoperative fluoroscopy during right femoral neck fracture fixation. Electronically Signed   By: Andrea Gasman M.D.   On: 03/23/2024 16:35   DG Wrist Complete Right Result Date: 03/23/2024 CLINICAL DATA:  Right wrist pain and swelling after fall EXAM: RIGHT WRIST - COMPLETE 3+ VIEW COMPARISON:  None Available. FINDINGS: There is no evidence of fracture or dislocation. Mild narrowing of first carpometacarpal joint is noted. Soft tissues are unremarkable. IMPRESSION: Mild osteoarthritis of first carpometacarpal joint. No acute abnormality seen. Electronically Signed   By: Lynwood Landy Raddle M.D.   On: 03/23/2024 08:54   DG HIP PORT UNILAT WITH PELVIS 1V RIGHT Result Date: 03/23/2024 CLINICAL DATA:  Right femoral neck fracture EXAM: DG HIP (WITH OR WITHOUT PELVIS) 1V PORT RIGHT COMPARISON:  April 30, 2013 FINDINGS: Mildly displaced and possibly comminuted fracture is seen involving the right femoral neck. IMPRESSION: Mildly displaced and possibly comminuted right femoral neck fracture. Electronically Signed   By: Lynwood Landy Raddle M.D.   On:  03/23/2024 08:52    Microbiology: Results for orders placed or performed during the hospital encounter of 03/22/24  Surgical PCR screen     Status: None   Collection Time: 03/23/24  1:20 AM   Specimen: Nasal Mucosa; Nasal Swab  Result Value Ref Range Status   MRSA, PCR NEGATIVE NEGATIVE Final   Staphylococcus aureus NEGATIVE NEGATIVE Final    Comment: (NOTE) The Xpert SA Assay (FDA approved for NASAL specimens in patients 70 years of age and older), is one component of a comprehensive surveillance program. It is not intended to diagnose infection nor to guide or monitor treatment. Performed at [facility], [address] [place], [place] [address]     Labs: CBC: Recent Labs  Lab 03/23/24 0306 03/23/24 1500 03/25/24 1022  WBC 9.7 13.1* 10.3  HGB 12.3 12.8 12.3  HCT 36.9 39.8 38.0  MCV 95.6 99.3 98.4  PLT 230 209 210   Basic Metabolic Panel: Recent Labs  Lab 03/23/24 0306 03/23/24 1500  NA 139  --   K 3.6  --   CL 106  --   CO2 26  --   GLUCOSE 164*  --   BUN 15  --   CREATININE 0.73 0.73  CALCIUM  9.3  --    Liver Function Tests: No results for input(s): AST, ALT, ALKPHOS, BILITOT, PROT, ALBUMIN in the last 168 hours. CBG: No results for input(s): GLUCAP in the last 168 hours.  Discharge time spent: greater than 30 minutes.  Signed: AIDA CHO, MD [facility] 03/25/2024 "

## 2024-04-06 ENCOUNTER — Encounter (HOSPITAL_COMMUNITY): Payer: Self-pay
